# Patient Record
Sex: Female | Born: 1945 | Hispanic: No | Marital: Married | Smoking: Never smoker
Health system: Southern US, Community
[De-identification: ages and names within clinical notes are randomized; demographics above are authoritative.]

## PROBLEM LIST (undated history)

## (undated) DIAGNOSIS — K5190 Ulcerative colitis, unspecified, without complications: Secondary | ICD-10-CM

## (undated) DIAGNOSIS — E785 Hyperlipidemia, unspecified: Secondary | ICD-10-CM

## (undated) DIAGNOSIS — R011 Cardiac murmur, unspecified: Secondary | ICD-10-CM

## (undated) DIAGNOSIS — Z9889 Other specified postprocedural states: Secondary | ICD-10-CM

## (undated) DIAGNOSIS — F419 Anxiety disorder, unspecified: Secondary | ICD-10-CM

## (undated) DIAGNOSIS — J45909 Unspecified asthma, uncomplicated: Secondary | ICD-10-CM

## (undated) DIAGNOSIS — M1990 Unspecified osteoarthritis, unspecified site: Secondary | ICD-10-CM

## (undated) DIAGNOSIS — Z8659 Personal history of other mental and behavioral disorders: Secondary | ICD-10-CM

## (undated) DIAGNOSIS — K589 Irritable bowel syndrome without diarrhea: Secondary | ICD-10-CM

## (undated) DIAGNOSIS — C4491 Basal cell carcinoma of skin, unspecified: Secondary | ICD-10-CM

## (undated) DIAGNOSIS — R112 Nausea with vomiting, unspecified: Secondary | ICD-10-CM

## (undated) DIAGNOSIS — D649 Anemia, unspecified: Secondary | ICD-10-CM

## (undated) DIAGNOSIS — D099 Carcinoma in situ, unspecified: Secondary | ICD-10-CM

## (undated) DIAGNOSIS — D229 Melanocytic nevi, unspecified: Secondary | ICD-10-CM

## (undated) DIAGNOSIS — K529 Noninfective gastroenteritis and colitis, unspecified: Secondary | ICD-10-CM

## (undated) HISTORY — PX: TONSILLECTOMY: SUR1361

## (undated) HISTORY — DX: Noninfective gastroenteritis and colitis, unspecified: K52.9

## (undated) HISTORY — DX: Ulcerative colitis, unspecified, without complications: K51.90

## (undated) HISTORY — DX: Melanocytic nevi, unspecified: D22.9

## (undated) HISTORY — DX: Basal cell carcinoma of skin, unspecified: C44.91

## (undated) HISTORY — PX: TUBAL LIGATION: SHX77

## (undated) HISTORY — DX: Carcinoma in situ, unspecified: D09.9

## (undated) HISTORY — DX: Unspecified asthma, uncomplicated: J45.909

## (undated) HISTORY — DX: Hyperlipidemia, unspecified: E78.5

---

## 1993-07-23 HISTORY — PX: BRONCHOSCOPY: SUR163

## 2007-07-01 DIAGNOSIS — C4491 Basal cell carcinoma of skin, unspecified: Secondary | ICD-10-CM

## 2007-07-01 HISTORY — DX: Basal cell carcinoma of skin, unspecified: C44.91

## 2007-07-24 HISTORY — PX: COLONOSCOPY: SHX174

## 2011-02-12 DIAGNOSIS — D099 Carcinoma in situ, unspecified: Secondary | ICD-10-CM

## 2011-02-12 DIAGNOSIS — D229 Melanocytic nevi, unspecified: Secondary | ICD-10-CM

## 2011-02-12 HISTORY — DX: Carcinoma in situ, unspecified: D09.9

## 2011-02-12 HISTORY — DX: Melanocytic nevi, unspecified: D22.9

## 2019-04-16 ENCOUNTER — Encounter: Payer: Self-pay | Admitting: Orthopaedic Surgery

## 2019-04-16 ENCOUNTER — Ambulatory Visit: Payer: Self-pay

## 2019-04-16 ENCOUNTER — Other Ambulatory Visit: Payer: Self-pay

## 2019-04-16 ENCOUNTER — Ambulatory Visit (INDEPENDENT_AMBULATORY_CARE_PROVIDER_SITE_OTHER): Payer: Medicare Other | Admitting: Orthopaedic Surgery

## 2019-04-16 VITALS — BP 136/72 | HR 94 | Ht 64.0 in | Wt 145.0 lb

## 2019-04-16 DIAGNOSIS — M25562 Pain in left knee: Secondary | ICD-10-CM | POA: Diagnosis not present

## 2019-04-16 NOTE — Progress Notes (Signed)
Office Visit Note   Patient: Carol Roach           Date of Birth: Nov 20, 1945           MRN: [id number] Visit Date: 04/16/2019              Requested by: No referring provider defined for this encounter. PCP: No primary care provider on file.   Assessment & Plan: Visit Diagnoses:  1. Acute pain of left knee     Plan: Patient may have small chondral area of her knee with negative radiographs versus meniscal tear with repetitive catching.  Recommend she use a cane in her opposite right hand.  Intra-articular injection performed which she tolerated well with improvement in her pain.  She has persistent symptoms after couple weeks to let us know we can consider MRI imaging.  Offered her some physical therapy she will call if she like to schedule this.  Follow-Up Instructions: No follow-ups on file.   Orders:  Orders Placed This Encounter  Procedures  . XR KNEE 3 VIEW LEFT   No orders of the defined types were placed in this encounter.     Procedures: Large Joint Inj: L knee on 04/16/2019 3:40 PM Indications: joint swelling and pain Details: 22 G 1.5 in needle, anterolateral approach  Arthrogram: No  Medications: 0.5 mL lidocaine 1 %; 3 mL bupivacaine 0.5 %; 40 mg methylPREDNISolone acetate 40 MG/ML Outcome: tolerated well, no immediate complications Procedure, treatment alternatives, risks and benefits explained, specific risks discussed. Consent was given by the patient. Immediately prior to procedure a time out was called to verify the correct patient, procedure, equipment, support staff and site/side marked as required. Patient was prepped and draped in the usual sterile fashion.       Clinical Data: No additional findings.   Subjective: Chief Complaint  Patient presents with  . Left Knee - Pain    HPI 73 year old female seen with a new onset of left knee pain that began about a month or month and a half ago when she was getting up from a stool in the kitchen  and twisted her left knee.  She states since that time her left knee is giving out intermittently with pain along the medial aspect.  Sometimes when she gets up and moves she has to help lift her leg reaching behind her thigh states her knee occasionally pops or grabs and then has associated swelling.  She is also had pain laterally over the knee more recently.  Review of Systems negative for hip pain.  Patient does take Symbicort also some Celebrex for her arthritis.  Seasonal allergies on Flonase.  Increased cholesterol currently taking Crestor.  Otherwise negative is obtains HPI.   Objective: Vital Signs: BP 136/72   Pulse 94   Ht 5' 4"  (1.626 m)   Wt 145 lb (65.8 kg)   BMI 24.89 kg/m   Physical Exam Constitutional:      Appearance: She is well-developed.  HENT:     Head: Normocephalic.     Right Ear: External ear normal.     Left Ear: External ear normal.  Eyes:     Pupils: Pupils are equal, round, and reactive to light.  Neck:     Thyroid: No thyromegaly.     Trachea: No tracheal deviation.  Cardiovascular:     Rate and Rhythm: Normal rate.  Pulmonary:     Effort: Pulmonary effort is normal.  Abdominal:     Palpations: Abdomen  is soft.  Skin:    General: Skin is warm and dry.  Neurological:     Mental Status: She is alert and oriented to person, place, and time.  Psychiatric:        Behavior: Behavior normal.     Ortho Exam patient had negative logroll to the hips.  With flexion extension patient has more lateral joint line pain the medial.  Tenderness of both joint lines collateral ligaments are stable.  Negative Lockman test.  ACL PCL exam is normal distal pulses are 2+ and symmetrical.  No knee effusion normal patellar tracking.  Minimal crepitus with knee extension slightly worse on asymptomatic right knee than left knee.  Specialty Comments:  No specialty comments available.  Imaging: No results found.   PMFS History: There are no active problems to display  for this patient.  Past Medical History:  Diagnosis Date  . Asthma   . 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 BCC (basal cell carcinoma of skin) 07/01/2007   Right Lower Med Eyelid  . Colitis   . 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  Past Surgical History:  Procedure Laterality Date  . TONSILLECTOMY    . TUBAL LIGATION     Social History   Occupational History  . Not on file  Tobacco Use  . Smoking status: Never Smoker  . Smokeless tobacco: Never Used  Substance and Sexual Activity  . Alcohol use: Not Currently  . Drug use: Not on file  . Sexual activity: Not on file

## 2019-04-17 MED ORDER — BUPIVACAINE HCL 0.5 % IJ SOLN
3.0000 mL | INTRAMUSCULAR | Status: AC | PRN
  Administered 2019-04-16: 16:00:00 3 mL via INTRA_ARTICULAR

## 2019-04-17 MED ORDER — METHYLPREDNISOLONE ACETATE 40 MG/ML IJ SUSP
40.0000 mg | INTRAMUSCULAR | Status: AC | PRN
  Administered 2019-04-16: 16:00:00 40 mg via INTRA_ARTICULAR

## 2019-04-17 MED ORDER — LIDOCAINE HCL 1 % IJ SOLN
0.5000 mL | INTRAMUSCULAR | Status: AC | PRN
  Administered 2019-04-16: .5 mL

## 2019-05-04 ENCOUNTER — Telehealth: Payer: Self-pay | Admitting: Orthopaedic Surgery

## 2019-05-04 DIAGNOSIS — M25562 Pain in left knee: Secondary | ICD-10-CM

## 2019-05-04 NOTE — Telephone Encounter (Signed)
Patient would like for you to call her. Her call back number is [phone]. Would like to ask if she could get MRI in [place].

## 2019-05-05 NOTE — Telephone Encounter (Signed)
OK thank you. Proceed with MRI of her knee ROV after test, thank you.

## 2019-05-05 NOTE — Addendum Note (Signed)
Addended by: Meyer Cory on: 05/05/2019 09:17 AM   Modules accepted: Orders

## 2019-05-05 NOTE — Telephone Encounter (Signed)
Per note in chart, patient was going to call back in a few weeks if she was having persistent symptoms and MRI would be considered. Dr. Lorin Mercy spoke with patient and advised for her to call me if she wished to proceed with the MRI since he would be out of the office. Patient continues to have problems and would like MRI to be done at [facility] in [place]. I did advise patient to bring CD of MRI with her to follow up appointment for review.  She will call [place] office to schedule that appointment once she has time and date for MRI. Order entered.

## 2019-05-05 NOTE — Telephone Encounter (Signed)
Please see below. MRI ordered as you had requested if patient called me with persistent symptoms.

## 2019-05-06 NOTE — Telephone Encounter (Signed)
Order entered

## 2019-06-04 ENCOUNTER — Encounter: Payer: Self-pay | Admitting: Orthopaedic Surgery

## 2019-06-04 ENCOUNTER — Ambulatory Visit (INDEPENDENT_AMBULATORY_CARE_PROVIDER_SITE_OTHER): Payer: Medicare Other | Admitting: Orthopaedic Surgery

## 2019-06-04 DIAGNOSIS — M1712 Unilateral primary osteoarthritis, left knee: Secondary | ICD-10-CM | POA: Diagnosis not present

## 2019-06-04 NOTE — Progress Notes (Signed)
Office Visit Note   Patient: Carol Roach           Date of Birth: 1946/01/21           MRN: [id number] Visit Date: 06/04/2019              Requested by: No referring provider defined for this encounter. PCP: Patient, No Pcp Per   Assessment & Plan: Visit Diagnoses:  1. Unilateral primary osteoarthritis, left knee     Plan: Patient is only mild arthritis.  MRI scan was reviewed with patient and husband and I gave her a copy of the report.  She will return if she has increased problems.  She is happy with the results of treatment and is very pleased she does not require any surgical intervention.  Follow-up as needed.  Follow-Up Instructions: No follow-ups on file.   Orders:  No orders of the defined types were placed in this encounter.  No orders of the defined types were placed in this encounter.     Procedures: No procedures performed   Clinical Data: No additional findings.   Subjective: Chief Complaint  Patient presents with  . Left Knee - Pain, Follow-up    MRI Review    HPI 73 year old female with turns with left knee pain.  She got some relief with the injection which she states worked great for many days but since that time she still getting some improvement.  She is taking some Aleve over-the-counter occasionally.  No fever chills no history of gout.  MRI scan has been obtained and is available for review.  This shows some chondromalacia patella and mild medial and lateral chondromalacia without focal areas of significant wear.  No meniscal tears ligaments are normal.  Patient states overall her knee is been doing better.  She not sure if modifying some of her activities is improved it or if it still residual from the intra-articular injection.  Review of Systems updated unchanged last office visit.   Objective: Vital Signs: Ht 5' 4"  (1.626 m)   Wt 140 lb (63.5 kg)   BMI 24.03 kg/m   Physical Exam Constitutional:      Appearance: She is  well-developed.  HENT:     Head: Normocephalic.     Right Ear: External ear normal.     Left Ear: External ear normal.  Eyes:     Pupils: Pupils are equal, round, and reactive to light.  Neck:     Thyroid: No thyromegaly.     Trachea: No tracheal deviation.  Cardiovascular:     Rate and Rhythm: Normal rate.  Pulmonary:     Effort: Pulmonary effort is normal.  Abdominal:     Palpations: Abdomen is soft.  Skin:    General: Skin is warm and dry.  Neurological:     Mental Status: She is alert and oriented to person, place, and time.  Psychiatric:        Behavior: Behavior normal.     Ortho Exam patient has good range of motion no palpable Baker's cyst which was noted on MRI with small Baker's cyst.  Distal pulses are intact negative logroll of the hips no knee effusion minimal crepitus with knee flexion extension.  Specialty Comments:  No specialty comments available.  Imaging: No results found.   PMFS History: Patient Active Problem List   Diagnosis Date Noted  . Unilateral primary osteoarthritis, left knee 06/04/2019   Past Medical History:  Diagnosis Date  . Asthma   .  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 BCC (basal cell carcinoma of skin) 07/01/2007   Right Lower Med Eyelid  . Colitis   . 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  Past Surgical History:  Procedure Laterality Date  . TONSILLECTOMY    . TUBAL LIGATION     Social History   Occupational History  . Not on file  Tobacco Use  . Smoking status: Never Smoker  . Smokeless tobacco: Never Used  Substance and Sexual Activity  . Alcohol use: Not Currently  . Drug use: Not on file  . Sexual activity: Not on file

## 2019-07-28 ENCOUNTER — Encounter: Payer: Self-pay | Admitting: *Deleted

## 2019-08-31 ENCOUNTER — Ambulatory Visit: Payer: PRIVATE HEALTH INSURANCE

## 2019-09-24 ENCOUNTER — Encounter: Payer: Self-pay | Admitting: Orthopaedic Surgery

## 2019-09-24 ENCOUNTER — Ambulatory Visit (INDEPENDENT_AMBULATORY_CARE_PROVIDER_SITE_OTHER): Payer: Medicare Other | Admitting: Orthopaedic Surgery

## 2019-09-24 VITALS — BP 129/71 | HR 96 | Ht 64.0 in | Wt 145.0 lb

## 2019-09-24 DIAGNOSIS — M1712 Unilateral primary osteoarthritis, left knee: Secondary | ICD-10-CM

## 2019-09-24 NOTE — Progress Notes (Signed)
Office Visit Note   Patient: Carol Roach           Date of Birth: 03-Aug-1945           MRN: [id number] Visit Date: 09/24/2019              Requested by: No referring provider defined for this encounter. PCP: Patient, No Pcp Per   Assessment & Plan: Visit Diagnoses:  1. Primary osteoarthritis of left knee     Plan: Knee injection performed with good relief.  If she develops locking or is not able to walk she will let us know.  Previous x-rays were reviewed.  Follow-Up Instructions: No follow-ups on file.   Orders:  Orders Placed This Encounter  Procedures  . Large Joint Inj: L knee   No orders of the defined types were placed in this encounter.     Procedures: Large Joint Inj: L knee on 09/24/2019 3:03 PM Indications: joint swelling and pain Details: 22 G 1.5 in needle, anterolateral approach  Arthrogram: No  Medications: 0.5 mL lidocaine 1 %; 3 mL bupivacaine 0.5 %; 40 mg methylPREDNISolone acetate 40 MG/ML Outcome: tolerated well, no immediate complications Procedure, treatment alternatives, risks and benefits explained, specific risks discussed. Consent was given by the patient. Immediately prior to procedure a time out was called to verify the correct patient, procedure, equipment, support staff and site/side marked as required. Patient was prepped and draped in the usual sterile fashion.       Clinical Data: No additional findings.   Subjective: Chief Complaint  Patient presents with  . Left Knee - Pain, Follow-up    HPI 74 year old female 3 to 4 months follow-up for left knee pain with mild osteoarthritis.  She states she has had some recent swelling difficulty walking stiffness in her knee.  Problems with stairs getting from sitting to standing she has been using a knee brace using ice and elevating her leg at night plus over-the-counter anti-inflammatories.  Previous knee injection in September gave her about 2 months of relief.  Review of  Systems 14 point systems positive for Symbicort for pulmonary problems she is used Celebrex other anti-inflammatories for knee symptoms.  Flonase for allergies.  Increased cholesterol.  Negative CVA or MI no chest pain.  Objective: Vital Signs: BP 129/71   Pulse 96   Ht 5' 4"  (1.626 m)   Wt 145 lb (65.8 kg)   BMI 24.89 kg/m   Physical Exam Constitutional:      Appearance: She is well-developed.  HENT:     Head: Normocephalic.     Right Ear: External ear normal.     Left Ear: External ear normal.  Eyes:     Pupils: Pupils are equal, round, and reactive to light.  Neck:     Thyroid: No thyromegaly.     Trachea: No tracheal deviation.  Cardiovascular:     Rate and Rhythm: Normal rate.  Pulmonary:     Effort: Pulmonary effort is normal.  Abdominal:     Palpations: Abdomen is soft.  Skin:    General: Skin is warm and dry.  Neurological:     Mental Status: She is alert and oriented to person, place, and time.  Psychiatric:        Behavior: Behavior normal.     Ortho Exam patient has some crepitus with range of motion left knee.  She has some medial lateral joint line tenderness pain with patellofemoral loading and knee extension.  Specialty Comments:  No specialty comments available.  Imaging: No results found.   PMFS History: Patient Active Problem List   Diagnosis Date Noted  . Unilateral primary osteoarthritis, left knee 06/04/2019   Past Medical History:  Diagnosis Date  . Asthma   . 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 BCC (basal cell carcinoma of skin) 07/01/2007   Right Lower Med Eyelid  . Colitis   . 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  Past Surgical History:  Procedure Laterality Date  . TONSILLECTOMY    . TUBAL LIGATION     Social History   Occupational History  . Not on file   Tobacco Use  . Smoking status: Never Smoker  . Smokeless tobacco: Never Used  Substance and Sexual Activity  . Alcohol use: Not Currently  . Drug use: Not on file  . Sexual activity: Not on file

## 2019-09-27 MED ORDER — LIDOCAINE HCL 1 % IJ SOLN
0.5000 mL | INTRAMUSCULAR | Status: AC | PRN
  Administered 2019-09-24: .5 mL

## 2019-09-27 MED ORDER — METHYLPREDNISOLONE ACETATE 40 MG/ML IJ SUSP
40.0000 mg | INTRAMUSCULAR | Status: AC | PRN
  Administered 2019-09-24: 40 mg via INTRA_ARTICULAR

## 2019-09-27 MED ORDER — BUPIVACAINE HCL 0.5 % IJ SOLN
3.0000 mL | INTRAMUSCULAR | Status: AC | PRN
  Administered 2019-09-24: 3 mL via INTRA_ARTICULAR

## 2019-10-02 ENCOUNTER — Telehealth: Payer: Self-pay | Admitting: Radiology

## 2019-10-02 ENCOUNTER — Telehealth: Payer: Self-pay | Admitting: Orthopaedic Surgery

## 2019-10-02 DIAGNOSIS — M1712 Unilateral primary osteoarthritis, left knee: Secondary | ICD-10-CM

## 2019-10-02 NOTE — Addendum Note (Signed)
Addended by: Meyer Cory on: 10/02/2019 04:59 PM   Modules accepted: Orders

## 2019-10-02 NOTE — Telephone Encounter (Signed)
I called and spoke with patient. She would like to go to [facility] in [place] for PT. Phone: [phone] Fax:  [phone]  Script for PT faxed. Patient aware.

## 2019-10-02 NOTE — Telephone Encounter (Signed)
Patient called.   She is requesting a call back from [place]. No further detail.   Call back: [phone]

## 2019-10-02 NOTE — Telephone Encounter (Signed)
I called her and discussed. Her MRI in the fall only showed mild chondral wear changes no definite meniscal tear. Set her up for some physical therapy and then will she will get the name and phone number and fax number she can fax the order evaluate and treat knee pain with giving way. She can follow-up with me in 5 to 6weeks. I told her we would make the phone call about getting approval for her therapy. Thank you

## 2019-10-02 NOTE — Telephone Encounter (Signed)
I called patient 

## 2019-10-02 NOTE — Telephone Encounter (Signed)
Patient called [facility] office and states injection that she received has not helped at all. She is concerned that she is going to fall and would like to schedule surgery.   Please advise. CB [phone]

## 2019-10-05 ENCOUNTER — Telehealth: Payer: Self-pay | Admitting: Orthopaedic Surgery

## 2019-10-05 NOTE — Telephone Encounter (Signed)
Can we change the location on the referral?

## 2019-10-05 NOTE — Telephone Encounter (Signed)
Patient called wanting to have PT in [place] instead of [place], [place].  CB#[phone].  Thank you.

## 2019-10-05 NOTE — Telephone Encounter (Signed)
Sent to [facility] PT in [place]. Patient notified.

## 2019-11-04 ENCOUNTER — Other Ambulatory Visit: Payer: Self-pay

## 2019-11-04 ENCOUNTER — Ambulatory Visit (INDEPENDENT_AMBULATORY_CARE_PROVIDER_SITE_OTHER): Payer: Self-pay | Admitting: *Deleted

## 2019-11-04 DIAGNOSIS — Z1211 Encounter for screening for malignant neoplasm of colon: Secondary | ICD-10-CM

## 2019-11-04 MED ORDER — NA SULFATE-K SULFATE-MG SULF 17.5-3.13-1.6 GM/177ML PO SOLN: 1.0000 | Freq: Once | ORAL | 0 refills | Status: AC

## 2019-11-04 NOTE — Patient Instructions (Signed)
Carol Roach  [birth year] MRN: [id number]     Procedure Date: 01/06/2020 Time to register: 8:30 am Place to register: [facility] Procedure Time: 9:30 am Scheduled provider: Dr. Gala Romney    PREPARATION FOR COLONOSCOPY WITH SUPREP BOWEL PREP KIT  Note: Suprep Bowel Prep Kit is a split-dose (2day) regimen. Consumption of BOTH 6-ounce bottles is required for a complete prep.  Please notify us immediately if you are diabetic, take iron supplements, or if you are on Coumadin or any other blood thinners.  Please hold the following medications: n/a                                                                                                                                                  2 DAYS BEFORE PROCEDURE:  DATE: 01/04/2020   DAY: Monday Begin clear liquid diet AFTER your lunch meal. NO SOLID FOODS after this point.  1 DAY BEFORE PROCEDURE:  DATE: 01/05/2020   DAY: Tuesday Continue clear liquids the entire day - NO SOLID FOOD.   Diabetic medications adjustments for today: n/a  At 6:00pm: Complete steps 1 through 4 below, using ONE (1) 6-ounce bottle, before going to bed. Step 1:  Pour ONE (1) 6-ounce bottle of SUPREP liquid into the mixing container.  Step 2:  Add cool drinking water to the 16 ounce line on the container and mix.  Note: Dilute the solution concentrate as directed prior to use. Step 3:  DRINK ALL the liquid in the container. Step 4:  You MUST drink an additional two (2) or more 16 ounce containers of water over the next one (1) hour.   Continue clear liquids.  DAY OF PROCEDURE:   DATE: 01/06/2020   DAY: Wednesday If you take medications for your heart, blood pressure, or breathing, you may take these medications.  Diabetic medications adjustments for today: n/a  5 hours before your procedure at 4:30 am: Step 1:  Pour ONE (1) 6-ounce bottle of SUPREP liquid into the mixing container.  Step 2:  Add cool drinking water to the 16 ounce line on the  container and mix.  Note: Dilute the solution concentrate as directed prior to use. Step 3:  DRINK ALL the liquid in the container. Step 4:  You MUST drink an additional two (2) or more 16 ounce containers of water over the next one (1) hour. You MUST complete the final glass of water at least 3 hours before your colonoscopy. Nothing by mouth past 6:30 am.  You may take your morning medications with sip of water unless we have instructed otherwise.    Please see below for Dietary Information.  CLEAR LIQUIDS INCLUDE:  Water Jello (NOT red in color)   Ice Popsicles (NOT red in color)   Tea (sugar ok, no milk/cream) Powdered fruit flavored drinks  Coffee (sugar ok, no  milk/cream) Gatorade/ Lemonade/ Kool-Aid  (NOT red in color)   Juice: apple, white grape, white cranberry Soft drinks  Clear bullion, consomme, broth (fat free beef/chicken/vegetable)  Carbonated beverages (any kind)  Strained chicken noodle soup Hard Candy   Remember: Clear liquids are liquids that will allow you to see your fingers on the other side of a clear glass. Be sure liquids are NOT red in color, and not cloudy, but CLEAR.  DO NOT EAT OR DRINK ANY OF THE FOLLOWING:  Dairy products of any kind   Cranberry juice Tomato juice / V8 juice   Grapefruit juice Orange juice     Red grape juice  Do not eat any solid foods, including such foods as: cereal, oatmeal, yogurt, fruits, vegetables, creamed soups, eggs, bread, crackers, pureed foods in a blender, etc.   HELPFUL HINTS FOR DRINKING PREP SOLUTION:   Make sure prep is extremely cold. Mix and refrigerate the the morning of the prep. You may also put in the freezer.   You may try mixing some Crystal Light or Country Time Lemonade if you prefer. Mix in small amounts; add more if necessary.  Try drinking through a straw  Rinse mouth with water or a mouthwash between glasses, to remove after-taste.  Try sipping on a cold beverage /ice/ popsicles between glasses of  prep.  Place a piece of sugar-free hard candy in mouth between glasses.  If you become nauseated, try consuming smaller amounts, or stretch out the time between glasses. Stop for 30-60 minutes, then slowly start back drinking.     OTHER INSTRUCTIONS  You will need a responsible adult at least 75 years of age to accompany you and drive you home. This person must remain in the waiting room during your procedure. The hospital will cancel your procedure if you do not have a responsible adult with you.   1. Wear loose fitting clothing that is easily removed. 2. Leave jewelry and other valuables at home.  3. Remove all body piercing jewelry and leave at home. 4. Total time from sign-in until discharge is approximately 2-3 hours. 5. You should go home directly after your procedure and rest. You can resume normal activities the day after your procedure. 6. The day of your procedure you should not:  Drive  Make legal decisions  Operate machinery  Drink alcohol  Return to work   You may call the office (Dept: [phone]) before 5:00pm, or page the doctor on call [phone]) after 5:00pm, for further instructions, if necessary.   Insurance Information YOU WILL NEED TO CHECK WITH YOUR INSURANCE COMPANY FOR THE BENEFITS OF COVERAGE YOU HAVE FOR THIS PROCEDURE.  UNFORTUNATELY, NOT ALL INSURANCE COMPANIES HAVE BENEFITS TO COVER ALL OR PART OF THESE TYPES OF PROCEDURES.  IT IS YOUR RESPONSIBILITY TO CHECK YOUR BENEFITS, HOWEVER, WE WILL BE GLAD TO ASSIST YOU WITH ANY CODES YOUR INSURANCE COMPANY MAY NEED.    PLEASE NOTE THAT MOST INSURANCE COMPANIES WILL NOT COVER A SCREENING COLONOSCOPY FOR PEOPLE UNDER THE AGE OF 50  IF YOU HAVE BCBS INSURANCE, YOU MAY HAVE BENEFITS FOR A SCREENING COLONOSCOPY BUT IF POLYPS ARE FOUND THE DIAGNOSIS WILL CHANGE AND THEN YOU MAY HAVE A DEDUCTIBLE THAT WILL NEED TO BE MET. SO PLEASE MAKE SURE YOU CHECK YOUR BENEFITS FOR A SCREENING COLONOSCOPY AS WELL AS A  DIAGNOSTIC COLONOSCOPY.

## 2019-11-04 NOTE — Progress Notes (Addendum)
Gastroenterology Pre-Procedure Review  Request Date: 11/04/2019 Requesting Physician: Dr. Rogelia Mire, Last TCS 12/31/2007 done by Dr. [place] Carbo, nonspecific mild chronic inflammation of colon  PATIENT REVIEW QUESTIONS: The patient responded to the following health history questions as indicated:    1. Diabetes Melitis: no 2. Joint replacements in the past 12 months: no 3. Major health problems in the past 3 months: no 4. Has an artificial valve or MVP: no 5. Has a defibrillator: no 6. Has been advised in past to take antibiotics in advance of a procedure like teeth cleaning: yes, but hasn't had to do it in over 20 years 7. Family history of colon cancer: no 8. Alcohol Use: no 9. Illicit drug Use: no 10. History of sleep apnea: no  11. History of coronary artery or other vascular stents placed within the last 12 months: no 12. History of any prior anesthesia complications: yes, n/v 13. There is no height or weight on file to calculate BMI. ht: 5'4 1/5 wt: 145 lbs     MEDICATIONS & ALLERGIES:    Patient reports the following regarding taking any blood thinners:   Plavix? no Aspirin? no Coumadin? no Brilinta? no Xarelto? no Eliquis? no Pradaxa? no Savaysa? no Effient? no  Patient confirms/reports the following medications:  Current Outpatient Medications  Medication Sig Dispense Refill  . Ascorbic Acid (VITAMIN C) 1000 MG tablet Take 1,000 mg by mouth daily.    . betamethasone valerate (VALISONE) 0.1 % cream Apply topically as needed.    . Calcium Carb-Cholecalciferol (CALCIUM-VITAMIN D3) 600-200 MG-UNIT TABS Take by mouth daily.     . cetirizine (ZYRTEC) 10 MG tablet Take 10 mg by mouth as needed.     . Cholecalciferol (VITAMIN D) 50 MCG (2000 UT) tablet Take 2,000 Units by mouth daily.    . Magnesium 500 MG TABS Take by mouth daily.    . montelukast (SINGULAIR) 10 MG tablet Take 10 mg by mouth daily.    . Multiple Vitamins-Minerals (CENTRUM SILVER ADULT 50+ PO) Take by  mouth daily.     . rosuvastatin (CRESTOR) 5 MG tablet Take 5 mg by mouth daily.    . Triamcinolone Acetonide (NASACORT ALLERGY 24HR NA) Place into the nose as needed.     . vitamin B-12 (CYANOCOBALAMIN) 500 MCG tablet Take 1,000 mcg by mouth daily.      No current facility-administered medications for this visit.    Patient confirms/reports the following allergies:  Allergies  Allergen Reactions  . Codeine   . Latex     No orders of the defined types were placed in this encounter.   AUTHORIZATION INFORMATION Primary Insurance: Medicare,  ID #: [id number] Pre-Cert / Auth required: No, not required  Secondary Insurance: [employer]  ID #: [id number] Pre-Cert / Josem Kaufmann required: Pre-Cert / Auth #:   SCHEDULE INFORMATION: Procedure has been scheduled as follows:  Date: 01/06/2020, Time: 9:30 Location: Dr. Gala Romney  This Gastroenterology Pre-Precedure Review Form is being routed to the following provider(s): Roseanne Kaufman, NP

## 2019-11-09 NOTE — Progress Notes (Signed)
May need to verify this with Almyra Free, but do we bring patients in to discuss risks/benefits if 44?

## 2019-11-10 ENCOUNTER — Telehealth: Payer: Self-pay | Admitting: *Deleted

## 2019-11-10 NOTE — Telephone Encounter (Signed)
Tried to call pt but no vm set up.

## 2019-11-11 NOTE — Telephone Encounter (Signed)
Spoke with pt and informed her that we were cancelling the procedure and Covid screening.  She was informed that she would need an ov first to discuss risks/benefits since she is 67.  She was informed that usually one is not required for ages 81 and up unless she just elected to do so or if there was a problem.  Pt stated she didn't see any need in doing another one.  She said that she would call us if she has any problems.

## 2019-11-11 NOTE — Progress Notes (Signed)
Spoke with pt and informed her that we were cancelling the procedure and Covid screening.  She was informed that she would need an ov first to discuss risks/benefits since she is 55.  She was informed that usually one is not required for ages 34 and up unless she just elected to do so or if there was a problem.  Pt stated she didn't see any need in doing another one.  She said that she would call us if she has any problems.

## 2020-01-04 ENCOUNTER — Other Ambulatory Visit (HOSPITAL_COMMUNITY): Payer: PRIVATE HEALTH INSURANCE

## 2020-01-21 ENCOUNTER — Ambulatory Visit (INDEPENDENT_AMBULATORY_CARE_PROVIDER_SITE_OTHER): Payer: Medicare Other | Admitting: Orthopaedic Surgery

## 2020-01-21 ENCOUNTER — Encounter: Payer: Self-pay | Admitting: Orthopaedic Surgery

## 2020-01-21 ENCOUNTER — Ambulatory Visit: Payer: Self-pay

## 2020-01-21 VITALS — BP 107/70 | HR 92 | Ht 64.0 in | Wt 145.0 lb

## 2020-01-21 DIAGNOSIS — M542 Cervicalgia: Secondary | ICD-10-CM

## 2020-01-21 DIAGNOSIS — M545 Low back pain, unspecified: Secondary | ICD-10-CM

## 2020-01-21 DIAGNOSIS — G8929 Other chronic pain: Secondary | ICD-10-CM

## 2020-01-21 NOTE — Progress Notes (Signed)
Office Visit Note   Patient: Carol Roach           Date of Birth: 10-28-1945           MRN: [id number] Visit Date: 01/21/2020              Requested by: Donalynn Furlong, [place] [address] PCP: Donalynn Furlong, MD   Assessment & Plan: Visit Diagnoses:  1. Neck pain   2. Chronic bilateral low back pain, unspecified whether sciatica present     Plan: Patient still able to play golf.  We discussed lumbar imaging if her symptoms got worse.  She has good cervical range of motion no brachial plexus tenderness and intact upper extremity reflexes.  Principal problem appears to be lumbar L3-4 good activity level still is been good.  If she develops progressive radicular symptoms she can return otherwise recheck 3 months.  Left knee symptoms seem to be fairly stable.  She has to be careful with uneven ground.  Follow-Up Instructions: No follow-ups on file.   Orders:  Orders Placed This Encounter  Procedures   XR Cervical Spine 2 or 3 views   XR Lumbar Spine 2-3 Views   No orders of the defined types were placed in this encounter.     Procedures: No procedures performed   Clinical Data: No additional findings.   Subjective: Chief Complaint  Patient presents with   Left Knee - Pain, Follow-up    HPI 74 year old female returns she states the injection in her knee really did not help she continues to have back pain some buttocks pain pain in her thigh and states she was having some difficulty with ambulation until she got it the massage was has helped.  She states she was walking straight after that.  Patient states she still been able to play golf.  She denies any bowel bladder associated symptoms no numbness.  She does have some discomfort lumbosacral junction more left buttocks and right.  Review of Systems Review of systems update unchanged from 09/24/2019 other than as mentioned HPI.  Objective: Vital Signs: BP 107/70     Pulse 92    Ht 5' 4"  (1.626 m)    Wt 145 lb (65.8 kg)    BMI 24.89 kg/m   Physical Exam Constitutional:      Appearance: She is well-developed.  HENT:     Head: Normocephalic.     Right Ear: External ear normal.     Left Ear: External ear normal.  Eyes:     Pupils: Pupils are equal, round, and reactive to light.  Neck:     Thyroid: No thyromegaly.     Trachea: No tracheal deviation.  Cardiovascular:     Rate and Rhythm: Normal rate.  Pulmonary:     Effort: Pulmonary effort is normal.  Abdominal:     Palpations: Abdomen is soft.  Skin:    General: Skin is warm and dry.  Neurological:     Mental Status: She is alert and oriented to person, place, and time.  Psychiatric:        Behavior: Behavior normal.     Ortho Exam patient has negative straight leg raising 90 degrees.  No limitation of internal rotation right or left hip but has limitation of left hip external rotation only 30 degrees not particularly painful.  She cannot figure 4 on the left but can do it on the right.  Knee reach full  extension.  Mild sciatic notch tenderness on the left negative on the right knee and ankle jerk are intact.  No atrophy good ankle dorsiflexion.  Specialty Comments:  No specialty comments available.  Imaging: No results found.   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  Past Surgical History:  Procedure Laterality Date   TONSILLECTOMY     TUBAL LIGATION     Social History   Occupational History   Not on file    Tobacco Use   Smoking status: Never Smoker   Smokeless tobacco: Never Used  Substance and Sexual Activity   Alcohol use: Not Currently   Drug use: Not on file   Sexual activity: Not on file

## 2020-04-21 ENCOUNTER — Other Ambulatory Visit: Payer: Self-pay

## 2020-04-21 ENCOUNTER — Ambulatory Visit (INDEPENDENT_AMBULATORY_CARE_PROVIDER_SITE_OTHER): Payer: Medicare Other | Admitting: Orthopaedic Surgery

## 2020-04-21 DIAGNOSIS — M1712 Unilateral primary osteoarthritis, left knee: Secondary | ICD-10-CM

## 2020-04-21 NOTE — Progress Notes (Signed)
Office Visit Note   Patient: Carol Roach           Date of Birth: June 18, 1946           MRN: [id number] Visit Date: 04/21/2020              Requested by: Donalynn Furlong, [place] [address] PCP: Donalynn Furlong, MD   Assessment & Plan: Visit Diagnoses:  1. Unilateral primary osteoarthritis, left knee   2.     Low back pain  Plan: Patient does improve with therapy.  She can continue using some heat stretching walking activity.  She still able play golf frequently and will call if she has increased symptoms that limit her activity.  Follow-Up Instructions: No follow-ups on file.   Orders:  No orders of the defined types were placed in this encounter.  No orders of the defined types were placed in this encounter.     Procedures: No procedures performed   Clinical Data: No additional findings.   Subjective: Chief Complaint  Patient presents with  . Left Knee - Follow-up  . Lower Back - Follow-up    HPI 74 year old female returns with some ongoing problems with back pain and left knee osteoarthritis.  She states she still been able to play golf.  She has some problems with stairs sometimes squatting or stepping up on a pulse that can be somewhat of a problem.  She has some back pain buttocks pain.  She is finished therapy and felt like that is been helpful for her.  She denies any numbness or tingling in her feet.  She uses heat at bedtime and also has been using some Aleve which is improved her symptoms.  Review of Systems 14 point update unchanged from 09/24/2019 office visit.   Objective: Vital Signs: There were no vitals taken for this visit.  Physical Exam Constitutional:      Appearance: She is well-developed.  HENT:     Head: Normocephalic.     Right Ear: External ear normal.     Left Ear: External ear normal.  Eyes:     Pupils: Pupils are equal, round, and reactive to light.  Neck:     Thyroid: No  thyromegaly.     Trachea: No tracheal deviation.  Cardiovascular:     Rate and Rhythm: Normal rate.  Pulmonary:     Effort: Pulmonary effort is normal.  Abdominal:     Palpations: Abdomen is soft.  Skin:    General: Skin is warm and dry.  Neurological:     Mental Status: She is alert and oriented to person, place, and time.  Psychiatric:        Behavior: Behavior normal.     Ortho Exam negative straight leg raising 90 degrees.  Some crepitus with knee flexion and extension and patellofemoral loading.  No varus or valgus deformity.  Distal pulses are intact.  Ambulation without limp.  Negative Trendelenburg.  Specialty Comments:  No specialty comments available.  Imaging: No results found.   PMFS History: Patient Active Problem List   Diagnosis Date Noted  . Unilateral primary osteoarthritis, left knee 06/04/2019   Past Medical History:  Diagnosis Date  . Asthma   . 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 BCC (basal cell carcinoma of skin) 07/01/2007   Right  Lower Med Eyelid  . Colitis   . 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  Past Surgical History:  Procedure Laterality Date  . TONSILLECTOMY    . TUBAL LIGATION     Social History   Occupational History  . Not on file  Tobacco Use  . Smoking status: Never Smoker  . Smokeless tobacco: Never Used  Substance and Sexual Activity  . Alcohol use: Not Currently  . Drug use: Not on file  . Sexual activity: Not on file

## 2020-06-01 ENCOUNTER — Ambulatory Visit: Payer: PRIVATE HEALTH INSURANCE | Admitting: Dermatology

## 2020-06-09 ENCOUNTER — Encounter: Payer: Self-pay | Admitting: Gastroenterology

## 2020-06-23 ENCOUNTER — Encounter: Payer: Self-pay | Admitting: Orthopaedic Surgery

## 2020-06-23 ENCOUNTER — Ambulatory Visit (INDEPENDENT_AMBULATORY_CARE_PROVIDER_SITE_OTHER): Payer: Medicare Other | Admitting: Orthopaedic Surgery

## 2020-06-23 ENCOUNTER — Other Ambulatory Visit: Payer: Self-pay

## 2020-06-23 ENCOUNTER — Ambulatory Visit (INDEPENDENT_AMBULATORY_CARE_PROVIDER_SITE_OTHER): Payer: Medicare Other

## 2020-06-23 VITALS — Ht 65.5 in | Wt 142.0 lb

## 2020-06-23 DIAGNOSIS — M1712 Unilateral primary osteoarthritis, left knee: Secondary | ICD-10-CM

## 2020-06-23 NOTE — Progress Notes (Signed)
Office Visit Note   Patient: Carol Roach           Date of Birth: [birth year]           MRN: [id number] Visit Date: 06/23/2020              Requested by: Donalynn Furlong, [place] [address] PCP: Donalynn Furlong, MD   Assessment & Plan: Visit Diagnoses:  1. Unilateral primary osteoarthritis, left knee     Plan: Patient's had locking symptoms in her knee 3 days where she could not walk.  She did not have full extension of her knee but now is able to ambulate with only moderate discomfort and can get her knee fully extended.  We will proceed with MRI scan rule out lateral meniscal tear or cartilage flap tear.  I discussed again with her currently her left knee radiographs do not look severe enough to consider total knee arthroplasty at this point.  She may have more significant arthritis than plain radiographs would suggest that this also will can be determined from MRI scan.  Follow-up after left knee MRI.  Follow-Up Instructions: Return after left knee MRI scan.  Orders:  Orders Placed This Encounter  Procedures   XR Knee 1-2 Views Left   No orders of the defined types were placed in this encounter.     Procedures: No procedures performed   Clinical Data: No additional findings.   Subjective: Chief Complaint  Patient presents with   Left Knee - Pain    HPI 74 year old female returns with ongoing problems with left knee pain.  Patient states Sunday Monday and Tuesday she could barely put her foot down.  She only plays golf regularly.  She use some Salonpas without significant relief.  She states is doing better although she did go to [facility] and got an IM injection in her buttocks with steroids and not sure if that helped.  She states she has had catching in her left knee with sharp pain along the lateral joint line.  She states is so painful that she is ready to proceed with total knee arthroplasty.  She denies any  specific injury that occurred when her knee locked up with sharp lateral joint line pain.  She denies any groin pain no associated back symptoms with the knee but she has had some discomfort at the lumbosacral junction at times and some tenderness over the sciatic notch but it does not seem to stop her from playing golf.  Review of Systems all of systems updated and are noncontributory.   Objective: Vital Signs: Ht 5' 5.5" (1.664 m)    Wt 142 lb (64.4 kg)    BMI 23.27 kg/m   Physical Exam Constitutional:      Appearance: She is well-developed.  HENT:     Head: Normocephalic.     Right Ear: External ear normal.     Left Ear: External ear normal.  Eyes:     Pupils: Pupils are equal, round, and reactive to light.  Neck:     Thyroid: No thyromegaly.     Trachea: No tracheal deviation.  Cardiovascular:     Rate and Rhythm: Normal rate.  Pulmonary:     Effort: Pulmonary effort is normal.  Abdominal:     Palpations: Abdomen is soft.  Skin:    General: Skin is warm and dry.  Neurological:     Mental Status: She is alert and oriented  to person, place, and time.  Psychiatric:        Behavior: Behavior normal.     Ortho Exam patient has lateral joint line tenderness.  Collateral ligaments are stable ACL PCL exam is normal there is trace effusion.  With flexion extension she feels sharp pain laterally over the lateral joint line at the lateral collateral ligament.  No Baker's cyst pes bursa is normal negative logroll of the hips.  Mild sciatic notch tenderness left greater than right.  Minimal tenderness lumbosacral junction in the midline.  Specialty Comments:  No specialty comments available.  Imaging: No results found.   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  Past Surgical History:  Procedure Laterality Date   TONSILLECTOMY     TUBAL LIGATION     Social History   Occupational History   Not on file  Tobacco Use   Smoking status: Never Smoker   Smokeless tobacco: Never Used  Substance and Sexual Activity   Alcohol use: Not Currently   Drug use: Not on file   Sexual activity: Not on file

## 2020-06-23 NOTE — Addendum Note (Signed)
Addended by: Meyer Cory on: 06/23/2020 04:41 PM   Modules accepted: Orders

## 2020-07-21 ENCOUNTER — Encounter: Payer: Self-pay | Admitting: Orthopaedic Surgery

## 2020-07-21 ENCOUNTER — Ambulatory Visit (INDEPENDENT_AMBULATORY_CARE_PROVIDER_SITE_OTHER): Payer: Medicare Other | Admitting: Orthopaedic Surgery

## 2020-07-21 ENCOUNTER — Other Ambulatory Visit: Payer: Self-pay

## 2020-07-21 VITALS — Ht 65.5 in | Wt 142.0 lb

## 2020-07-21 DIAGNOSIS — M1712 Unilateral primary osteoarthritis, left knee: Secondary | ICD-10-CM

## 2020-07-21 NOTE — Progress Notes (Signed)
Office Visit Note   Patient: Carol Roach           Date of Birth: 03-08-1946           MRN: [id number] Visit Date: 07/21/2020              Requested by: Donalynn Furlong, [place] [address] PCP: Donalynn Furlong, MD   Assessment & Plan: Visit Diagnoses:  1. Unilateral primary osteoarthritis, left knee     Plan: Patient in 3 exercise program anti-inflammatories including ibuprofen, Celebrex.  Intra-articular injections are not effective.  MRI scan shows tricompartmental degenerative changes.  Patient states she like to go ahead and proceed with total knee arthroplasty so she be ready for golf late spring and summertime.  We discussed overnight stay, home physical therapy, outpatient therapy after home therapy.  Risks of infection.  We discussed likely spinal anesthesia abductor block, Exparel Marcaine.  Questions were elicited and answered.  Her husband was present included in the discussion.  Patient understands and requests we proceed with surgical scheduling.  Follow-Up Instructions: No follow-ups on file.   Orders:  No orders of the defined types were placed in this encounter.  No orders of the defined types were placed in this encounter.     Procedures: No procedures performed   Clinical Data: No additional findings.   Subjective: Chief Complaint  Patient presents with  . Left Knee - Pain, Follow-up    MRI review    HPI 74 year old female returns with ongoing problems with left knee osteoarthritis.  She has been treated conservatively with anti-inflammatories previous injection.  She has degenerative changes present on her plain radiograph consistent with moderate osteoarthritis and an MRI scan has been obtained which shows tricompartmental degenerative changes with severe chondromalacia patella focal defects on the femoral-tibial compartments and intact meniscus.  She has had persistent catching problems with her knee  that bothers her when she tries to play golf which is her avocation.  Patient states she is now ready to proceed with total knee arthroplasty since anti-inflammatories and injections have not been effective.  Review of Systems 14 point system positive for seasonal allergies increased cholesterol.  Negative for CVA negative for chest pain.  Prior to progressive knee osteoarthritis patient is played golf several times a week.   Objective: Vital Signs: Ht 5' 5.5" (1.664 m)   Wt 142 lb (64.4 kg)   BMI 23.27 kg/m   Physical Exam Constitutional:      Appearance: She is well-developed.  HENT:     Head: Normocephalic.     Right Ear: External ear normal.     Left Ear: External ear normal.  Eyes:     Pupils: Pupils are equal, round, and reactive to light.  Neck:     Thyroid: No thyromegaly.     Trachea: No tracheal deviation.  Cardiovascular:     Rate and Rhythm: Normal rate.  Pulmonary:     Effort: Pulmonary effort is normal.  Abdominal:     Palpations: Abdomen is soft.  Skin:    General: Skin is warm and dry.  Neurological:     Mental Status: She is alert and oriented to person, place, and time.  Psychiatric:        Mood and Affect: Mood and affect normal.        Behavior: Behavior normal.     Ortho Exam patient has crepitus with left knee range of motion medial lateral  joint line tenderness.  Negative logroll the hips distal pulses are 2+ and symmetrical.  No varus or valgus deformity.  She reaches full extension flexes past 120 degrees.  Specialty Comments:  No specialty comments available.  Imaging: Knee MRI done in [place] on 07/06/2020 shows tricompartmental degenerative changes severe patellofemoral changes bone-on-bone changes and focal areas of cartilage wear medial lateral compartment.  Menisci and ACL PCL were intact.   PMFS History: Patient Active Problem List   Diagnosis Date Noted  . Unilateral primary osteoarthritis, left knee 06/04/2019   Past Medical  History:  Diagnosis Date  . Asthma   . 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 BCC (basal cell carcinoma of skin) 07/01/2007   Right Lower Med Eyelid  . Colitis   . 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  Past Surgical History:  Procedure Laterality Date  . TONSILLECTOMY    . TUBAL LIGATION     Social History   Occupational History  . Not on file  Tobacco Use  . Smoking status: Never Smoker  . Smokeless tobacco: Never Used  Substance and Sexual Activity  . Alcohol use: Not Currently  . Drug use: Not on file  . Sexual activity: Not on file

## 2020-07-27 ENCOUNTER — Ambulatory Visit (INDEPENDENT_AMBULATORY_CARE_PROVIDER_SITE_OTHER): Payer: Medicare Other | Admitting: Gastroenterology

## 2020-07-27 ENCOUNTER — Other Ambulatory Visit: Payer: Self-pay

## 2020-07-27 ENCOUNTER — Encounter: Payer: Self-pay | Admitting: Gastroenterology

## 2020-07-27 DIAGNOSIS — K529 Noninfective gastroenteritis and colitis, unspecified: Secondary | ICD-10-CM | POA: Diagnosis not present

## 2020-07-27 DIAGNOSIS — K5900 Constipation, unspecified: Secondary | ICD-10-CM | POA: Insufficient documentation

## 2020-07-27 MED ORDER — LINACLOTIDE 72 MCG PO CAPS: 72.0000 ug | ORAL_CAPSULE | Freq: Every day | ORAL | 0 refills | Status: DC

## 2020-07-27 NOTE — Progress Notes (Signed)
Primary Care Physician:  Dhivianathan, Candida Peeling, MD  Primary Gastroenterologist:  Garfield Cornea, MD   Chief Complaint  Patient presents with  . Consult    Last TCS 5-6 years ago in [place], [place]. Reports was normal  . Constipation    Reports saw Dr. Colen Darling in [place], [place] and was told she had a fissure. Rx'd nitro ointment    HPI:  Carol Roach is a 75 y.o. female here for consideration of colonoscopy. She was referred by Dr. Rogelia Mire.  We received referral back in April for colonoscopy, at that time she was advised that given her age of greater than 5, we would offer her office visit to discuss possible colonoscopy.  She was made aware that typically after 75, guidelines suggest she could forego ongoing colon cancer screening.  She elected not to pursue colonoscopy at that time.  She declined office visit.  She states she was not having any problems and she will call if there is any issues.  Her last colonoscopy we have on file was in June 2009 with Dr. West Carbo, found to have nonspecific mild chronic inflammation of the colon.  Indication for procedure, surveillance due to chronic ulcerative colitis.  Patient states that Dr. Davina Poke recommended she consider a colonoscopy and referred her again. She states she has a history of ulcerative colitis. Her sister had ulcerative colitis and had to have total colectomy. Denies FH colon cancer.  Patient was originally diagnosed with UC back in the 38s.  She had a flare in the 80s as well.  In the beginning she was taking Azulfidine, prescribed Dr. West Carbo.  Denies any recent regimen for UC. Patient had been doing well but over the past six months she has felt like her stools were incomplete. She developed rectal pain over the summer associated with constipation.  She was seen by her old vascular surgeon (who did hemorrhoid procedure on her 8-9 years ago), who states that she was having issues from a fissure and prescribed her nitroglycerin  ointment.  Rectal pain resolved after couple of weeks of topical nitroglycerin.  Last month she had recurrent rectal pain and started using her cream again and is doing better.  She denies any rectal bleeding.  She notes that her stools feel incomplete.  She has to go back a couple of times to finish.  Generally has several bowel movements every morning.  She has been taking either magnesium or a stool softener with laxative to facilitate bowel movements.  Rarely misses a day of one or the other of these medications.  She does have some postprandial urgency especially if she eats out.  Denies any significant abdominal pain.  No nausea or vomiting.  No upper GI symptoms.  Her biggest concern is trying to get her left knee replacement surgery scheduled.  Her granddaughter gets married in March and she wants to be fully recovered by then.  Patient states that she would like to consider having 1 more colonoscopy after she recovers from her knee surgery.  She notes that she typically vomits with liquid bowel preps.  Does not tolerate "the gallon" jug.  Prefers to have all her prep the day before her procedure.     Current Outpatient Medications  Medication Sig Dispense Refill  . Ascorbic Acid (VITAMIN C) 1000 MG tablet Take 1,000 mg by mouth daily.    . betamethasone valerate (VALISONE) 0.1 % cream Apply topically as needed.    . Calcium Carb-Cholecalciferol (CALCIUM-VITAMIN D3) 600-200 MG-UNIT  TABS Take by mouth daily.     . cetirizine (ZYRTEC) 10 MG tablet Take 10 mg by mouth as needed.     . Cholecalciferol (VITAMIN D) 50 MCG (2000 UT) tablet Take 2,000 Units by mouth daily.    .       . Magnesium 500 MG TABS Take by mouth daily.    . montelukast (SINGULAIR) 10 MG tablet Take 10 mg by mouth daily.    . Multiple Vitamins-Minerals (CENTRUM SILVER ADULT 50+ PO) Take by mouth daily.     . nitroGLYCERIN (NITROGLYN) 2 % ointment Apply topically 4 (four) times daily. 1% as needed    . rosuvastatin  (CRESTOR) 5 MG tablet Take 5 mg by mouth daily.    . vitamin B-12 (CYANOCOBALAMIN) 500 MCG tablet Take 1,000 mcg by mouth daily.      No current facility-administered medications for this visit.    Allergies as of 07/27/2020 - Review Complete 07/27/2020  Allergen Reaction Noted  . Codeine  10/14/2018  . Latex  10/14/2018    Past Medical History:  Diagnosis Date  . Asthma   . 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 BCC (basal cell carcinoma of skin) 07/01/2007   Right Lower Med Eyelid  . Hyperlipidemia   . 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 Ulcerative colitis [facility])         Past Surgical History:  Procedure Laterality Date  . COLONOSCOPY  2009   Dr. West Carbo: Procedure being done for surveillance for chronic ulcerative colitis.  No endoscopic appearance of active colitis.  Random colon biopsies taken to look for dysplasia.  Biopsy showed nonspecific mild chronic inflammation of the colon  . TONSILLECTOMY    . TUBAL LIGATION      Family History  Problem Relation Age of Onset  . Colitis Sister        require total colectomy. UC  . Lung cancer Sister   . Colon cancer Neg Hx     Social History   Socioeconomic History  . Marital status: Married    Spouse name: Duane  . Number of children: Not on file  . Years of education: Not on file  . Highest education level: Not on file  Occupational History  . Not on file  Tobacco Use  . Smoking status: Never Smoker  . Smokeless tobacco: Never Used  Substance and Sexual Activity  . Alcohol use: Not Currently  . Drug use: Not on file  . Sexual activity: Not on file  Other Topics Concern  . Not on file  Social History Narrative  . Not on file   Social Determinants of Health   Financial Resource Strain: Not on file  Food Insecurity: Not on file  Transportation Needs: Not on file  Physical  Activity: Not on file  Stress: Not on file  Social Connections: Not on file  Intimate Partner Violence: Not on file      ROS:  General: Negative for anorexia, weight loss, fever, chills, fatigue, weakness. Eyes: Negative for vision changes.  ENT: Negative for hoarseness, difficulty swallowing , nasal congestion. CV: Negative for chest pain, angina, palpitations, dyspnea on exertion, peripheral edema.  Respiratory: Negative for dyspnea at rest, dyspnea on exertion, cough, sputum, wheezing.  GI: See history of present illness. GU:  Negative for dysuria, hematuria, urinary incontinence, urinary  frequency, nocturnal urination.  MS: Negative for low back pain.  Chronic left knee pain Derm: Negative for rash or itching.  Neuro: Negative for weakness, abnormal sensation, seizure, frequent headaches, memory loss, confusion.  Psych: Negative for anxiety, depression, suicidal ideation, hallucinations.  Endo: Negative for unusual weight change.  Heme: Negative for bruising or bleeding. Allergy: Negative for rash or hives.    Physical Examination:  BP 126/71   Pulse 87   Temp (!) 96.9 F (36.1 C)   Ht 5' 6"  (1.676 m)   Wt 141 lb 6.4 oz (64.1 kg)   BMI 22.82 kg/m    General: Well-nourished, well-developed in no acute distress.  Head: Normocephalic, atraumatic.   Eyes: Conjunctiva pink, no icterus. Mouth: masked. Neck: Supple without thyromegaly, masses, or lymphadenopathy.  Lungs: Clear to auscultation bilaterally.  Heart: Regular rate and rhythm, no murmurs rubs or gallops.  Abdomen: Bowel sounds are normal, nontender, nondistended, no hepatosplenomegaly or masses, no abdominal bruits or    hernia , no rebound or guarding.   Rectal: not performed Extremities: No lower extremity edema. No clubbing or deformities.  Neuro: Alert and oriented x 4 , grossly normal neurologically.  Skin: Warm and dry, no rash or jaundice.   Psych: Alert and cooperative, normal mood and  affect.   Imaging Studies: No results found.  Impression/plan:  Pleasant 75 year old female with history of chronic ulcerative colitis dating back since the 1970s, has not been on any maintenance regimen for years, presents to discuss possible colonoscopy.  Patient believes her last colonoscopy was 5 to 6 years ago by Dr. West Carbo, prior to his retirement. We have requested most recent colonoscopy and pathology available.  We did have record of 2009 colonoscopy, endoscopically no active UC.  Random biopsies showed nonspecific chronic inflammation of the colon but no dysplasia.    Patient has noted some change in bowel habits over the past 6 months.  Has had more issues with constipation than normal for her.  She feels like her stools are not complete.  She has several bowel movements every morning.  Complains of postprandial urgency when she eats out.  Currently alternating magnesium with stool softener with laxative.  She is interested in pursuing colonoscopy which is not unreasonable given her change in bowel habits and history of UC.  However, she is in the middle of scheduling left knee replacement which she feels like is have a party for her given her amount of pain.  Recommend she call to schedule colonoscopy once her knee surgery has been completed.  She is aware that will take sometimes 4 to 6 weeks to get her on the schedule for colonoscopy.  In the interim, we will have her hold magnesium/stool softener with laxative.  Trial of low-dose Linzess 72 mcg daily.  Samples provided.  She will call if she has good results and would like a prescription.  Advised her to let us know if she has any rectal bleeding, abdominal pain/rectal pain, persistent diarrhea.

## 2020-07-27 NOTE — Patient Instructions (Signed)
1. Try Linzess once daily on empty stomach to assist with bowel movements. If stools too frequent, you can skip a day or two of medication and then resume. Hold magnesium and stool softener with laxative while on Linzess. 2. If you decide Linzess is helpful, call for prescription. 3. Call after your knee surgery when you are ready to schedule your colonoscopy.  4. We will try to get copy of your last colonoscopy for review.

## 2020-07-27 NOTE — Progress Notes (Signed)
Cc'ed to pcp °

## 2020-08-05 ENCOUNTER — Telehealth: Payer: Self-pay | Admitting: Gastroenterology

## 2020-08-05 NOTE — Telephone Encounter (Signed)
Records received from Dr. Barnabas Lister Spainhour  Colonoscopy June 2009: Surveillance for chronic ulcerative colitis.  No evidence of active ulcerative colitis.  Biopsies showed nonspecific mild chronic inflammation of the colon.  Colonoscopy April 2006: Indication for procedure, surveillance colonoscopy for ulcerative colitis.  Quiescent colitis.  Mild internal hemorrhoids.  Mild sigmoid diverticulosis.  Multiple biopsies taken.  Biopsy showed chronic inflammation of the colon consistent with quiescent ulcerative colitis.  Colonoscopy from February 2002: Indication, rectal bleeding and mucus.  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  Colonoscopy June 2013: Received pathology from June 2013 colonoscopy.  Unfortunately I did not get a copy of the colonoscopy report.  On random colon biopsies she had mild focal active colitis, inflammation nonspecific.  Compatible with quiescent ulcerative colitis.  Dysplastic changes not seen.

## 2020-08-17 ENCOUNTER — Other Ambulatory Visit: Payer: Self-pay

## 2020-08-17 NOTE — Progress Notes (Signed)
[place], [place] [address] Phone: [phone] Fax: [phone]      Your procedure is scheduled on Friday February 4th.  Report to [facility] Main Entrance "A" at 5:30 A.M., and check in at the Admitting office.  Call this number if you have problems the morning of surgery:  [phone]  Call [phone] if you have any questions prior to your surgery date Monday-Friday 8am-4pm    Remember:  Do not eat after midnight the night before your surgery  You may drink clear liquids until 4:30am the morning of your surgery.   Clear liquids allowed are: Water, Non-Citrus Juices (without pulp), Carbonated Beverages, Clear Tea, Black Coffee Only, and Gatorade    Take these medicines the morning of surgery with A SIP OF WATER   montelukast (SINGULAIR) 10 MG tablet  rosuvastatin (CRESTOR) 5 MG tablet  If Needed  cetirizine (ZYRTEC) 10 MG tablet     As of today, STOP taking any Aspirin (unless otherwise instructed by your surgeon) Aleve, Naproxen, Ibuprofen, Motrin, Advil, Goody's, BC's, all herbal medications, fish oil, and all vitamins.                     Do not wear jewelry, make up, or nail polish            Do not wear lotions, powders, perfumes, or deodorant.            Do not shave 48 hours prior to surgery.              Do not bring valuables to the hospital.            [facility] is not responsible for any belongings or valuables.  Do NOT Smoke (Tobacco/Vaping) or drink Alcohol 24 hours prior to your procedure If you use a CPAP at night, you may bring all equipment for your overnight stay.   Contacts, glasses, dentures or bridgework may not be worn into surgery.      For patients admitted to the hospital, discharge time will be determined by your treatment team.   Patients discharged the day of surgery will not be allowed to drive home, and someone needs to stay with them for 24 hours.    Special  instructions:   [place]- Preparing For Surgery  Before surgery, you can play an important role. Because skin is not sterile, your skin needs to be as free of germs as possible. You can reduce the number of germs on your skin by washing with CHG (chlorahexidine gluconate) Soap before surgery.  CHG is an antiseptic cleaner which kills germs and bonds with the skin to continue killing germs even after washing.    Oral Hygiene is also important to reduce your risk of infection.  Remember - BRUSH YOUR TEETH THE MORNING OF SURGERY WITH YOUR REGULAR TOOTHPASTE  Please do not use if you have an allergy to CHG or antibacterial soaps. If your skin becomes reddened/irritated stop using the CHG.  Do not shave (including legs and underarms) for at least 48 hours prior to first CHG shower. It is OK to shave your face.  Please follow these instructions carefully.   1. Shower the NIGHT BEFORE SURGERY and the MORNING OF SURGERY with CHG Soap.   2. If you chose to wash your hair, wash your hair first as usual with your normal shampoo.  3. After you shampoo, rinse your hair and  body thoroughly to remove the shampoo.  4. Use CHG as you would any other liquid soap. You can apply CHG directly to the skin and wash gently with a scrungie or a clean washcloth.   5. Apply the CHG Soap to your body ONLY FROM THE NECK DOWN.  Do not use on open wounds or open sores. Avoid contact with your eyes, ears, mouth and genitals (private parts). Wash Face and genitals (private parts)  with your normal soap.   6. Wash thoroughly, paying special attention to the area where your surgery will be performed.  7. Thoroughly rinse your body with warm water from the neck down.  8. DO NOT shower/wash with your normal soap after using and rinsing off the CHG Soap.  9. Pat yourself dry with a CLEAN TOWEL.  10. Wear CLEAN PAJAMAS to bed the night before surgery  11. Place CLEAN SHEETS on your bed the night of your first shower and  DO NOT SLEEP WITH PETS.   Day of Surgery: Wear Clean/Comfortable clothing the morning of surgery Do not apply any deodorants/lotions.   Remember to brush your teeth WITH YOUR REGULAR TOOTHPASTE.   Please read over the following fact sheets that you were given.

## 2020-08-18 ENCOUNTER — Other Ambulatory Visit: Payer: Self-pay

## 2020-08-18 ENCOUNTER — Encounter: Payer: Self-pay | Admitting: Surgery

## 2020-08-18 ENCOUNTER — Ambulatory Visit (INDEPENDENT_AMBULATORY_CARE_PROVIDER_SITE_OTHER): Payer: Medicare Other | Admitting: Surgery

## 2020-08-18 ENCOUNTER — Inpatient Hospital Stay (HOSPITAL_COMMUNITY)
Admission: RE | Admit: 2020-08-18 | Discharge: 2020-08-18 | Disposition: A | Discharge status: Home / Self Care | Payer: Medicare Other | Source: Ambulatory Visit

## 2020-08-18 DIAGNOSIS — M1712 Unilateral primary osteoarthritis, left knee: Secondary | ICD-10-CM

## 2020-08-18 NOTE — Progress Notes (Signed)
Pt called to inform her of surgery needing to be rescheduled. Directed her to call Dr. Lorin Mercy office for further instruction. Message left on voicemail.

## 2020-08-18 NOTE — Progress Notes (Signed)
75 year old white female history of end-stage DJD left knee pain came in today for preop evaluation for left total knee replacement.  Patient was advised by surgery scheduler and myself today she would be expected to discharge home the same day from the hospital.  Patient's husband also present during this visit.  Multiple questions were answered.  Have some concern about pain during the immediate postop period.  The like to postpone the surgery at least until we are able to keep her in the hospital for at least 24 hours postop to make sure that she has better pain management.  All questions were answered and patient and husband are both very appreciative with this visit.

## 2020-08-23 ENCOUNTER — Other Ambulatory Visit (HOSPITAL_COMMUNITY): Payer: Medicare Other

## 2020-09-01 ENCOUNTER — Encounter: Payer: Self-pay | Admitting: Surgery

## 2020-09-01 ENCOUNTER — Ambulatory Visit (INDEPENDENT_AMBULATORY_CARE_PROVIDER_SITE_OTHER): Payer: Medicare Other | Admitting: Surgery

## 2020-09-01 ENCOUNTER — Other Ambulatory Visit: Payer: Self-pay

## 2020-09-01 VITALS — BP 139/79 | HR 87 | Temp 97.9°F

## 2020-09-01 DIAGNOSIS — M1712 Unilateral primary osteoarthritis, left knee: Secondary | ICD-10-CM

## 2020-09-02 NOTE — Progress Notes (Signed)
Your procedure is scheduled on Wednesday, September 07, 2020 from 12:30PM - 2:33PM .  Report to [facility] Main Entrance "A" at 5:30 A.M., and check in at the Admitting office.  Call this number if you have problems the morning of surgery:  [phone]  Call [phone] if you have any questions prior to your surgery date Monday-Friday 8am-4pm    Remember:  Do not eat or drink after midnight the night before your surgery     Take these medicines the morning of surgery with A SIP OF WATER   montelukast (SINGULAIR)  rosuvastatin (CRESTOR)   cetirizine (ZYRTEC) - if needed    As of today, STOP taking any Aspirin (unless otherwise instructed by your surgeon) Aleve, Naproxen, Ibuprofen, Motrin, Advil, Goody's, BC's, all herbal medications, fish oil, and all vitamins.                     Do not wear jewelry, make up, or nail polish            Do not wear lotions, powders, perfumes, or deodorant.            Do not shave 48 hours prior to surgery.              Do not bring valuables to the hospital.            [facility] is not responsible for any belongings or valuables.  Do NOT Smoke (Tobacco/Vaping) or drink Alcohol 24 hours prior to your procedure If you use a CPAP at night, you may bring all equipment for your overnight stay.   Contacts, glasses, dentures or bridgework may not be worn into surgery.      For patients admitted to the hospital, discharge time will be determined by your treatment team.   Patients discharged the day of surgery will not be allowed to drive home, and someone needs to stay with them for 24 hours.    Special instructions:   [place]- Preparing For Surgery  Before surgery, you can play an important role. Because skin is not sterile, your skin needs to be as free of germs as possible. You can reduce the number of germs on your skin by washing with CHG (chlorahexidine gluconate) Soap before surgery.  CHG is an antiseptic cleaner which kills germs and  bonds with the skin to continue killing germs even after washing.    Oral Hygiene is also important to reduce your risk of infection.  Remember - BRUSH YOUR TEETH THE MORNING OF SURGERY WITH YOUR REGULAR TOOTHPASTE  Please do not use if you have an allergy to CHG or antibacterial soaps. If your skin becomes reddened/irritated stop using the CHG.  Do not shave (including legs and underarms) for at least 48 hours prior to first CHG shower. It is OK to shave your face.  Please follow these instructions carefully.   1. Shower the NIGHT BEFORE SURGERY and the MORNING OF SURGERY with CHG Soap.   2. If you chose to wash your hair, wash your hair first as usual with your normal shampoo.  3. After you shampoo, rinse your hair and body thoroughly to remove the shampoo.  4. Use CHG as you would any other liquid soap. You can apply CHG directly to the skin and wash gently with a scrungie or a clean washcloth.   5. Apply the CHG Soap to your body ONLY FROM THE NECK DOWN.  Do not use on open wounds or open  sores. Avoid contact with your eyes, ears, mouth and genitals (private parts). Wash Face and genitals (private parts)  with your normal soap.   6. Wash thoroughly, paying special attention to the area where your surgery will be performed.  7. Thoroughly rinse your body with warm water from the neck down.  8. DO NOT shower/wash with your normal soap after using and rinsing off the CHG Soap.  9. Pat yourself dry with a CLEAN TOWEL.  10. Wear CLEAN PAJAMAS to bed the night before surgery  11. Place CLEAN SHEETS on your bed the night of your first shower and DO NOT SLEEP WITH PETS.   Day of Surgery: Wear Clean/Comfortable clothing the morning of surgery Do not apply any deodorants/lotions.   Remember to brush your teeth WITH YOUR REGULAR TOOTHPASTE.   Please read over the following fact sheets that you were given.

## 2020-09-05 ENCOUNTER — Encounter (HOSPITAL_COMMUNITY): Payer: Self-pay

## 2020-09-05 ENCOUNTER — Other Ambulatory Visit (HOSPITAL_COMMUNITY)
Admission: RE | Admit: 2020-09-05 | Discharge: 2020-09-05 | Disposition: A | Discharge status: Home / Self Care | Payer: Medicare Other | Source: Ambulatory Visit | Attending: Orthopaedic Surgery | Admitting: Orthopaedic Surgery

## 2020-09-05 ENCOUNTER — Other Ambulatory Visit: Payer: Self-pay

## 2020-09-05 ENCOUNTER — Encounter (HOSPITAL_COMMUNITY)
Admission: RE | Admit: 2020-09-05 | Discharge: 2020-09-05 | Disposition: A | Discharge status: Home / Self Care | Payer: Medicare Other | Source: Ambulatory Visit | Attending: Orthopaedic Surgery | Admitting: Orthopaedic Surgery

## 2020-09-05 DIAGNOSIS — Z01812 Encounter for preprocedural laboratory examination: Secondary | ICD-10-CM | POA: Insufficient documentation

## 2020-09-05 DIAGNOSIS — Z20822 Contact with and (suspected) exposure to covid-19: Secondary | ICD-10-CM | POA: Insufficient documentation

## 2020-09-05 HISTORY — DX: Nausea with vomiting, unspecified: R11.2

## 2020-09-05 HISTORY — DX: Cardiac murmur, unspecified: R01.1

## 2020-09-05 HISTORY — DX: Other specified postprocedural states: Z98.890

## 2020-09-05 HISTORY — DX: Anxiety disorder, unspecified: F41.9

## 2020-09-05 HISTORY — DX: Anemia, unspecified: D64.9

## 2020-09-05 HISTORY — DX: Irritable bowel syndrome, unspecified: K58.9

## 2020-09-05 HISTORY — DX: Personal history of other mental and behavioral disorders: Z86.59

## 2020-09-05 HISTORY — DX: Unspecified osteoarthritis, unspecified site: M19.90

## 2020-09-05 LAB — CBC
HCT: 47.6 % — ABNORMAL HIGH (ref 36.0–46.0)
Hemoglobin: 15.3 g/dL — ABNORMAL HIGH (ref 12.0–15.0)
MCH: 30.8 pg (ref 26.0–34.0)
MCHC: 32.1 g/dL (ref 30.0–36.0)
MCV: 95.8 fL (ref 80.0–100.0)
Platelets: 272 10*3/uL (ref 150–400)
RBC: 4.97 MIL/uL (ref 3.87–5.11)
RDW: 12.2 % (ref 11.5–15.5)
WBC: 7 10*3/uL (ref 4.0–10.5)
nRBC: 0 % (ref 0.0–0.2)

## 2020-09-05 LAB — SURGICAL PCR SCREEN
MRSA, PCR: NEGATIVE
Staphylococcus aureus: NEGATIVE

## 2020-09-05 NOTE — Pre-Procedure Instructions (Signed)
Carol Roach  09/05/2020    Your procedure is scheduled on Wednesday, September 07, 2020 at 12:30 PM.   Report to [facility] Entrance "A" Admitting Office at 10:30 AM.   Call this number if you have problems the morning of surgery: [phone]   Questions prior to day of surgery, please call [phone] between 8 & 4 PM.   Remember:  Do not eat food after midnight Tuesday, 09/06/20.  You may drink clear liquids until 9:30 AM.  Clear liquids allowed are:  Water, Juice (non-citric and without pulp - diabetics please choose diet or no sugar options), Carbonated beverages - (diabetics please choose diet or no sugar options), Clear Tea, Black Coffee only (no creamer, milk or cream including half and half) and Gatorade (diabetics please choose diet or no sugar options)    Take these medicines the morning of surgery with A SIP OF WATER: Montelukast (Singulair), Rosuvastatin (Crestor), Cetirizine (Zyrtec) - if needed.  Stop Vitamins and NSAIDS (Aleve, Naprosyn, Ibuprofen, etc) as of today prior to surgery. Do not use Aspirin containing products (BC Powders, Goody's, etc), Fish Oil or Herbal medications prior to surgery.    Do not wear jewelry, make-up or nail polish.  Do not wear lotions, powders, perfumes or deodorant.  Do not shave 48 hours prior to surgery.    Do not bring valuables to the hospital.  [facility] is not responsible for any belongings or valuables.  Contacts, dentures or bridgework may not be worn into surgery.  Leave your suitcase in the car.  After surgery it may be brought to your room.  For patients admitted to the hospital, discharge time will be determined by your treatment team.  Patients discharged the day of surgery will not be allowed to drive home.   [place] - Preparing for Surgery  Before surgery, you can play an important role.  Because skin is not sterile, your skin needs to be as free of germs as possible.  You can reduce the number of germs  on you skin by washing with CHG (chlorahexidine gluconate) soap before surgery.  CHG is an antiseptic cleaner which kills germs and bonds with the skin to continue killing germs even after washing.  Oral Hygiene is also important in reducing the risk of infection.  Remember to brush your teeth with your regular toothpaste the morning of surgery.  Please DO NOT use if you have an allergy to CHG or antibacterial soaps.  If your skin becomes reddened/irritated stop using the CHG and inform your nurse when you arrive at Short Stay.  Do not shave (including legs and underarms) for at least 48 hours prior to the first CHG shower.  You may shave your face.  Please follow these instructions carefully:   1.  Shower with CHG Soap the night before surgery and the morning of Surgery.  2.  If you choose to wash your hair, wash your hair first as usual with your normal shampoo.  3.  After you shampoo, rinse your hair and body thoroughly to remove the shampoo. 4.  Use CHG as you would any other liquid soap.  You can apply chg directly to the skin and wash gently with a      scrungie or washcloth.           5.  Apply the CHG Soap to your body ONLY FROM THE NECK DOWN.   Do not use on open wounds or open sores. Avoid contact with your eyes,  ears, mouth and genitals (private parts).  Wash genitals (private parts) with your normal soap - do this prior to using CHG soap.  6.  Wash thoroughly, paying special attention to the area where your surgery will be performed.  7.  Thoroughly rinse your body with warm water from the neck down.  8.  DO NOT shower/wash with your normal soap after using and rinsing off the CHG Soap.  9.  Pat yourself dry with a clean towel.            10.  Wear clean pajamas.            11.  Place clean sheets on your bed the night of your first shower and do not sleep with pets.  Day of Surgery  Shower as above. Do not apply any lotions/deodrants the morning of surgery.   Please wear clean  clothes to the hospital. Remember to brush your teeth with toothpaste.  Please read over the fact sheets that you were given.

## 2020-09-05 NOTE — Progress Notes (Signed)
PCP - Dr. Garey Ham Cardiologist - Dr. Darral Dash (no heart problems, just sees him because her previous PCP was a cardiologist and when he retired, she started going to Dr. Darral Dash). Hx of a heart murmur only   ECHO - 2021 (have requested records from [facility]) [place] - per Anesthesia protocol    COVID TEST- scheduled for today   Anesthesia review: yes - have requested clearance notes from Dr. Lorin Mercy office.  Patient denies shortness of breath, fever, cough and chest pain at PAT appointment   All instructions explained to the patient, with a verbal understanding of the material. Patient agrees to go over the instructions while at home for a better understanding. Patient also instructed to self quarantine after being tested for COVID-19. The opportunity to ask questions was provided.

## 2020-09-06 ENCOUNTER — Telehealth: Payer: Self-pay | Admitting: *Deleted

## 2020-09-06 LAB — SARS CORONAVIRUS 2 (TAT 6-24 HRS): SARS Coronavirus 2: NEGATIVE

## 2020-09-06 NOTE — Anesthesia Preprocedure Evaluation (Addendum)
Anesthesia Evaluation  Patient identified by MRN, date of birth, ID band Patient awake    Reviewed: Allergy & Precautions, NPO status , Patient's Chart, lab work & pertinent test results  History of Anesthesia Complications Negative for: history of anesthetic complications  Airway Mallampati: II  TM Distance: >3 FB Neck ROM: Full    Dental no notable dental hx.    Pulmonary asthma ,    Pulmonary exam normal        Cardiovascular negative cardio ROS Normal cardiovascular exam     Neuro/Psych Anxiety negative neurological ROS     GI/Hepatic Neg liver ROS, PUD,   Endo/Other  negative endocrine ROS  Renal/GU negative Renal ROS  negative genitourinary   Musculoskeletal negative musculoskeletal ROS (+)   Abdominal   Peds  Hematology negative hematology ROS (+)   Anesthesia Other Findings Day of surgery medications reviewed with patient.  Reproductive/Obstetrics negative OB ROS                             Anesthesia Physical Anesthesia Plan  ASA: II  Anesthesia Plan: Spinal   Post-op Pain Management:  Regional for Post-op pain   Induction:   PONV Risk Score and Plan: 3 and Treatment may vary due to age or medical condition, Ondansetron, Propofol infusion and Dexamethasone  Airway Management Planned: Natural Airway and Simple Face Mask  Additional Equipment: None  Intra-op Plan:   Post-operative Plan:   Informed Consent: I have reviewed the patients History and Physical, chart, labs and discussed the procedure including the risks, benefits and alternatives for the proposed anesthesia with the patient or authorized representative who has indicated his/her understanding and acceptance.     Dental advisory given  Plan Discussed with: CRNA  Anesthesia Plan Comments: (Follows with pulmonologist Dr. Davina Poke for history of allergic rhinitis and abnormal PFTs (small airway  obstruction).  Last seen 06/08/2020, per note her allergic rhinitis was significantly improved after completion of immunotherapy.  She was recommended follow-up in 1 year.  Preop labs reviewed, unremarkable.   TTE 08/20/2019 (copy on chart): Impression: 1.  Normal LV size with normal systolic function.  Ejection fraction is visually estimated at 60% and measured by BPS of 57%.  There is normal left ventricular wall thickness.  LV diastolic filling pattern is grossly normal with probably normal LV filling pressure.  Normal LA size and normal RVSP. 2.  Poorly seen right ventricle.  Grossly normal contractility and low estimated RA pressure with complete inspiratory collapse of the IVC. 3.  All cardiac valves are poorly visualized.  No gross structural or functional abnormality noted. 4.  Very poor technical quality, presumably due to advanced COPD.)       Anesthesia Quick Evaluation

## 2020-09-06 NOTE — Progress Notes (Signed)
Anesthesia Chart Review:  Follows with pulmonologist Dr. Davina Poke for history of allergic rhinitis and abnormal PFTs (small airway obstruction).  Last seen 06/08/2020, per note her allergic rhinitis was significantly improved after completion of immunotherapy.  She was recommended follow-up in 1 year.  Preop labs reviewed, unremarkable.   TTE 08/20/2019 (copy on chart): Impression: 1.  Normal LV size with normal systolic function.  Ejection fraction is visually estimated at 60% and measured by BPS of 57%.  There is normal left ventricular wall thickness.  LV diastolic filling pattern is grossly normal with probably normal LV filling pressure.  Normal LA size and normal RVSP. 2.  Poorly seen right ventricle.  Grossly normal contractility and low estimated RA pressure with complete inspiratory collapse of the IVC. 3.  All cardiac valves are poorly visualized.  No gross structural or functional abnormality noted. 4.  Very poor technical quality, presumably due to advanced COPD.   Carol Roach [facility] Stay Center/Anesthesiology Phone [phone] 09/06/2020 10:08 AM

## 2020-09-06 NOTE — Telephone Encounter (Signed)
[facility] RN Case manager assisted with setting up HHPT, OPPT for after Left total knee arthroplasty with Dr. Lorin Mercy on 09/07/20. Referral made to [facility] in the [place] area.Phone [phone], fax # [phone]. OPPT scheduled for 09/22/20 at 10:00 am with [facility] in [place] area. Please fax discharge instructions and orders to [facility] upon discharge.

## 2020-09-07 ENCOUNTER — Encounter (HOSPITAL_COMMUNITY): Payer: Self-pay | Admitting: Orthopaedic Surgery

## 2020-09-07 ENCOUNTER — Ambulatory Visit (HOSPITAL_COMMUNITY): Payer: Medicare Other | Admitting: Physician Assistant

## 2020-09-07 ENCOUNTER — Ambulatory Visit (HOSPITAL_COMMUNITY): Payer: Medicare Other | Admitting: Vascular Surgery

## 2020-09-07 ENCOUNTER — Observation Stay (HOSPITAL_COMMUNITY): Payer: Medicare Other

## 2020-09-07 ENCOUNTER — Encounter (HOSPITAL_COMMUNITY)
Admission: AD | Disposition: A | Discharge status: Home / Self Care | Payer: Self-pay | Source: Home / Self Care | Attending: Orthopaedic Surgery

## 2020-09-07 ENCOUNTER — Other Ambulatory Visit: Payer: Self-pay

## 2020-09-07 ENCOUNTER — Inpatient Hospital Stay (HOSPITAL_COMMUNITY)
Admission: AD | Admit: 2020-09-07 | Discharge: 2020-09-09 | DRG: 470 | Disposition: A | Discharge status: Home / Self Care | Payer: Medicare Other | Attending: Orthopaedic Surgery | Admitting: Orthopaedic Surgery

## 2020-09-07 DIAGNOSIS — E785 Hyperlipidemia, unspecified: Secondary | ICD-10-CM | POA: Diagnosis present

## 2020-09-07 DIAGNOSIS — M1712 Unilateral primary osteoarthritis, left knee: Principal | ICD-10-CM

## 2020-09-07 DIAGNOSIS — Z79899 Other long term (current) drug therapy: Secondary | ICD-10-CM

## 2020-09-07 DIAGNOSIS — Z91040 Latex allergy status: Secondary | ICD-10-CM

## 2020-09-07 DIAGNOSIS — Z96659 Presence of unspecified artificial knee joint: Secondary | ICD-10-CM

## 2020-09-07 DIAGNOSIS — Z09 Encounter for follow-up examination after completed treatment for conditions other than malignant neoplasm: Secondary | ICD-10-CM

## 2020-09-07 DIAGNOSIS — Z20822 Contact with and (suspected) exposure to covid-19: Secondary | ICD-10-CM | POA: Diagnosis present

## 2020-09-07 DIAGNOSIS — Z885 Allergy status to narcotic agent status: Secondary | ICD-10-CM

## 2020-09-07 DIAGNOSIS — J45909 Unspecified asthma, uncomplicated: Secondary | ICD-10-CM | POA: Diagnosis present

## 2020-09-07 DIAGNOSIS — Z85828 Personal history of other malignant neoplasm of skin: Secondary | ICD-10-CM

## 2020-09-07 HISTORY — PX: TOTAL KNEE ARTHROPLASTY: SHX125

## 2020-09-07 LAB — BASIC METABOLIC PANEL
Anion gap: 11 (ref 5–15)
BUN: 12 mg/dL (ref 8–23)
CO2: 23 mmol/L (ref 22–32)
Calcium: 9.4 mg/dL (ref 8.9–10.3)
Chloride: 106 mmol/L (ref 98–111)
Creatinine, Ser: 0.74 mg/dL (ref 0.44–1.00)
GFR, Estimated: >60 mL/min (ref >60)
Glucose, Bld: 84 mg/dL (ref 70–99)
Potassium: 3.9 mmol/L (ref 3.5–5.1)
Sodium: 140 mmol/L (ref 135–145)

## 2020-09-07 SURGERY — ARTHROPLASTY, KNEE, TOTAL
Anesthesia: Spinal | Site: Knee | Laterality: Left

## 2020-09-07 MED ORDER — MIDAZOLAM HCL 2 MG/2ML IJ SOLN
INTRAMUSCULAR | Status: AC
  Filled 2020-09-07: qty 2

## 2020-09-07 MED ORDER — METHOCARBAMOL 500 MG PO TABS
500.0000 mg | ORAL_TABLET | Freq: Four times a day (QID) | ORAL | Status: DC | PRN
  Administered 2020-09-07 – 2020-09-08 (×5): 500 mg via ORAL
  Filled 2020-09-07 (×4): qty 1

## 2020-09-07 MED ORDER — TRANEXAMIC ACID 1000 MG/10ML IV SOLN
2000.0000 mg | INTRAVENOUS | Status: DC
  Filled 2020-09-07: qty 20

## 2020-09-07 MED ORDER — PHENOL 1.4 % MT LIQD: 1.0000 | OROMUCOSAL | Status: DC | PRN

## 2020-09-07 MED ORDER — LIDOCAINE 2% (20 MG/ML) 5 ML SYRINGE
INTRAMUSCULAR | Status: DC | PRN
  Administered 2020-09-07: 60 mg via INTRAVENOUS

## 2020-09-07 MED ORDER — CEFAZOLIN SODIUM-DEXTROSE 1-4 GM/50ML-% IV SOLN
1.0000 g | Freq: Three times a day (TID) | INTRAVENOUS | Status: AC
  Administered 2020-09-07 – 2020-09-08 (×2): 1 g via INTRAVENOUS
  Filled 2020-09-07 (×2): qty 50

## 2020-09-07 MED ORDER — BUPIVACAINE LIPOSOME 1.3 % IJ SUSP
20.0000 mL | Freq: Once | INTRAMUSCULAR | Status: DC
  Filled 2020-09-07: qty 20

## 2020-09-07 MED ORDER — CEFAZOLIN SODIUM-DEXTROSE 2-4 GM/100ML-% IV SOLN
2.0000 g | INTRAVENOUS | Status: AC
  Administered 2020-09-07: 2 g via INTRAVENOUS
  Filled 2020-09-07: qty 100

## 2020-09-07 MED ORDER — CALCIUM CARBONATE-VITAMIN D 500-200 MG-UNIT PO TABS
1.0000 | ORAL_TABLET | Freq: Every day | ORAL | Status: DC
  Administered 2020-09-08 – 2020-09-09 (×2): 1 via ORAL
  Filled 2020-09-07 (×2): qty 1

## 2020-09-07 MED ORDER — BUPIVACAINE HCL (PF) 0.25 % IJ SOLN
INTRAMUSCULAR | Status: AC
  Filled 2020-09-07: qty 30

## 2020-09-07 MED ORDER — CLONIDINE HCL (ANALGESIA) 100 MCG/ML EP SOLN
EPIDURAL | Status: DC | PRN
  Administered 2020-09-07: 100 ug

## 2020-09-07 MED ORDER — TRANEXAMIC ACID-NACL 1000-0.7 MG/100ML-% IV SOLN
INTRAVENOUS | Status: AC
  Filled 2020-09-07: qty 100

## 2020-09-07 MED ORDER — OXYCODONE HCL 5 MG PO TABS
5.0000 mg | ORAL_TABLET | ORAL | Status: DC | PRN
  Administered 2020-09-07 – 2020-09-08 (×2): 5 mg via ORAL
  Filled 2020-09-07 (×2): qty 1

## 2020-09-07 MED ORDER — ONDANSETRON HCL 4 MG PO TABS: 4.0000 mg | ORAL_TABLET | Freq: Four times a day (QID) | ORAL | Status: DC | PRN

## 2020-09-07 MED ORDER — LIDOCAINE 2% (20 MG/ML) 5 ML SYRINGE
INTRAMUSCULAR | Status: AC
  Filled 2020-09-07: qty 5

## 2020-09-07 MED ORDER — CHLORHEXIDINE GLUCONATE 0.12 % MT SOLN
15.0000 mL | Freq: Once | OROMUCOSAL | Status: AC
  Administered 2020-09-07: 15 mL via OROMUCOSAL
  Filled 2020-09-07: qty 15

## 2020-09-07 MED ORDER — MENTHOL 3 MG MT LOZG: 1.0000 | LOZENGE | OROMUCOSAL | Status: DC | PRN

## 2020-09-07 MED ORDER — ASPIRIN EC 325 MG PO TBEC
325.0000 mg | DELAYED_RELEASE_TABLET | Freq: Every day | ORAL | 0 refills | Status: DC
Start: 2020-09-07 — End: 2021-10-03

## 2020-09-07 MED ORDER — LORATADINE 10 MG PO TABS: 10.0000 mg | ORAL_TABLET | Freq: Every day | ORAL | Status: DC

## 2020-09-07 MED ORDER — VITAMIN D 25 MCG (1000 UNIT) PO TABS
2000.0000 [IU] | ORAL_TABLET | Freq: Every day | ORAL | Status: DC
  Administered 2020-09-08 – 2020-09-09 (×2): 2000 [IU] via ORAL
  Filled 2020-09-07 (×2): qty 2

## 2020-09-07 MED ORDER — METOCLOPRAMIDE HCL 5 MG/ML IJ SOLN: 5.0000 mg | Freq: Three times a day (TID) | INTRAMUSCULAR | Status: DC | PRN

## 2020-09-07 MED ORDER — BUPIVACAINE LIPOSOME 1.3 % IJ SUSP
INTRAMUSCULAR | Status: DC | PRN
  Administered 2020-09-07: 20 mL

## 2020-09-07 MED ORDER — SODIUM CHLORIDE 0.9 % IR SOLN
Status: DC | PRN
  Administered 2020-09-07: 3000 mL

## 2020-09-07 MED ORDER — ORAL CARE MOUTH RINSE: 15.0000 mL | Freq: Once | OROMUCOSAL | Status: AC

## 2020-09-07 MED ORDER — ONDANSETRON HCL 4 MG/2ML IJ SOLN
INTRAMUSCULAR | Status: DC | PRN
  Administered 2020-09-07: 4 mg via INTRAVENOUS

## 2020-09-07 MED ORDER — ACETAMINOPHEN 325 MG PO TABS
325.0000 mg | ORAL_TABLET | Freq: Four times a day (QID) | ORAL | Status: DC | PRN
  Administered 2020-09-07 – 2020-09-09 (×3): 650 mg via ORAL
  Filled 2020-09-07 (×3): qty 2

## 2020-09-07 MED ORDER — LACTATED RINGERS IV SOLN: INTRAVENOUS | Status: DC

## 2020-09-07 MED ORDER — BUPIVACAINE-EPINEPHRINE (PF) 0.5% -1:200000 IJ SOLN
INTRAMUSCULAR | Status: DC | PRN
  Administered 2020-09-07: 15 mL via PERINEURAL

## 2020-09-07 MED ORDER — BUPIVACAINE IN DEXTROSE 0.75-8.25 % IT SOLN
INTRATHECAL | Status: DC | PRN
  Administered 2020-09-07: 1.6 mL via INTRATHECAL

## 2020-09-07 MED ORDER — DOCUSATE SODIUM 100 MG PO CAPS
100.0000 mg | ORAL_CAPSULE | Freq: Two times a day (BID) | ORAL | Status: DC
  Administered 2020-09-07 – 2020-09-09 (×4): 100 mg via ORAL
  Filled 2020-09-07 (×4): qty 1

## 2020-09-07 MED ORDER — POLYETHYLENE GLYCOL 3350 17 G PO PACK: 17.0000 g | PACK | Freq: Every day | ORAL | Status: DC | PRN

## 2020-09-07 MED ORDER — MONTELUKAST SODIUM 10 MG PO TABS
10.0000 mg | ORAL_TABLET | Freq: Every day | ORAL | Status: DC
  Administered 2020-09-08 – 2020-09-09 (×2): 10 mg via ORAL
  Filled 2020-09-07 (×2): qty 1

## 2020-09-07 MED ORDER — FENTANYL CITRATE (PF) 100 MCG/2ML IJ SOLN: 50.0000 ug | Freq: Once | INTRAMUSCULAR | Status: AC

## 2020-09-07 MED ORDER — METHOCARBAMOL 500 MG PO TABS: 500.0000 mg | ORAL_TABLET | Freq: Four times a day (QID) | ORAL | 0 refills | Status: DC | PRN

## 2020-09-07 MED ORDER — PHENYLEPHRINE HCL-NACL 10-0.9 MG/250ML-% IV SOLN
INTRAVENOUS | Status: DC | PRN
  Administered 2020-09-07: 40 ug/min via INTRAVENOUS

## 2020-09-07 MED ORDER — EPHEDRINE SULFATE 50 MG/ML IJ SOLN
INTRAMUSCULAR | Status: DC | PRN
  Administered 2020-09-07 (×2): 5 mg via INTRAVENOUS

## 2020-09-07 MED ORDER — TRANEXAMIC ACID-NACL 1000-0.7 MG/100ML-% IV SOLN
INTRAVENOUS | Status: DC | PRN
  Administered 2020-09-07: 1000 mg via INTRAVENOUS

## 2020-09-07 MED ORDER — FENTANYL CITRATE (PF) 100 MCG/2ML IJ SOLN
INTRAMUSCULAR | Status: AC
  Administered 2020-09-07: 50 ug via INTRAVENOUS
  Filled 2020-09-07: qty 2

## 2020-09-07 MED ORDER — METOCLOPRAMIDE HCL 5 MG PO TABS
5.0000 mg | ORAL_TABLET | Freq: Three times a day (TID) | ORAL | Status: DC | PRN
  Filled 2020-09-07: qty 2

## 2020-09-07 MED ORDER — DEXAMETHASONE SODIUM PHOSPHATE 10 MG/ML IJ SOLN
INTRAMUSCULAR | Status: DC | PRN
  Administered 2020-09-07: 4 mg via INTRAVENOUS

## 2020-09-07 MED ORDER — ASPIRIN EC 325 MG PO TBEC
325.0000 mg | DELAYED_RELEASE_TABLET | Freq: Every day | ORAL | Status: DC
  Administered 2020-09-08 – 2020-09-09 (×2): 325 mg via ORAL
  Filled 2020-09-07 (×3): qty 1

## 2020-09-07 MED ORDER — METHOCARBAMOL 500 MG PO TABS
ORAL_TABLET | ORAL | Status: AC
  Filled 2020-09-07: qty 1

## 2020-09-07 MED ORDER — ROSUVASTATIN CALCIUM 5 MG PO TABS
5.0000 mg | ORAL_TABLET | Freq: Every day | ORAL | Status: DC
Start: 2020-09-07 — End: 2020-09-09
  Administered 2020-09-07 – 2020-09-09 (×3): 5 mg via ORAL
  Filled 2020-09-07 (×3): qty 1

## 2020-09-07 MED ORDER — OXYCODONE-ACETAMINOPHEN 5-325 MG PO TABS
1.0000 | ORAL_TABLET | Freq: Four times a day (QID) | ORAL | 0 refills | Status: DC | PRN
Start: 2020-09-07 — End: 2020-09-08

## 2020-09-07 MED ORDER — BUPIVACAINE HCL (PF) 0.25 % IJ SOLN
INTRAMUSCULAR | Status: DC | PRN
  Administered 2020-09-07: 20 mL

## 2020-09-07 MED ORDER — METHOCARBAMOL 1000 MG/10ML IJ SOLN
500.0000 mg | Freq: Four times a day (QID) | INTRAVENOUS | Status: DC | PRN
  Filled 2020-09-07: qty 5

## 2020-09-07 MED ORDER — ONDANSETRON HCL 4 MG/2ML IJ SOLN: 4.0000 mg | Freq: Four times a day (QID) | INTRAMUSCULAR | Status: DC | PRN

## 2020-09-07 MED ORDER — HYDROMORPHONE HCL 1 MG/ML IJ SOLN
0.5000 mg | INTRAMUSCULAR | Status: DC | PRN
  Administered 2020-09-07: 0.5 mg via INTRAVENOUS
  Filled 2020-09-07: qty 0.5

## 2020-09-07 MED ORDER — PROPOFOL 500 MG/50ML IV EMUL
INTRAVENOUS | Status: DC | PRN
  Administered 2020-09-07: 50 ug/kg/min via INTRAVENOUS

## 2020-09-07 MED ORDER — SODIUM CHLORIDE 0.9 % IV SOLN: INTRAVENOUS | Status: DC

## 2020-09-07 MED ORDER — ONDANSETRON HCL 4 MG/2ML IJ SOLN
INTRAMUSCULAR | Status: AC
  Filled 2020-09-07: qty 2

## 2020-09-07 MED ORDER — PROPOFOL 10 MG/ML IV BOLUS
INTRAVENOUS | Status: AC
  Filled 2020-09-07: qty 20

## 2020-09-07 SURGICAL SUPPLY — 72 items
ATTUNE PS FEM LT SZ 6 CEM KNEE (Femur) ×2 IMPLANT
ATTUNE PSRP INSR SZ6 5 KNEE (Insert) ×2 IMPLANT
BANDAGE ESMARK 6X9 LF (GAUZE/BANDAGES/DRESSINGS) ×1 IMPLANT
BASE TIBIAL ROT PLAT SZ 5 KNEE (Knees) ×1 IMPLANT
BENZOIN TINCTURE PRP APPL 2/3 (GAUZE/BANDAGES/DRESSINGS) ×2 IMPLANT
BLADE SAGITTAL 25.0X1.19X90 (BLADE) ×2 IMPLANT
BLADE SAW SGTL 11.0X1.19X90.0M (BLADE) ×2 IMPLANT
BLADE SAW SGTL 13X75X1.27 (BLADE) ×2 IMPLANT
BNDG ELASTIC 4X5.8 VLCR STR LF (GAUZE/BANDAGES/DRESSINGS) ×2 IMPLANT
BNDG ELASTIC 6X5.8 VLCR STR LF (GAUZE/BANDAGES/DRESSINGS) ×2 IMPLANT
BNDG ESMARK 6X9 LF (GAUZE/BANDAGES/DRESSINGS) ×2
BOWL SMART MIX CTS (DISPOSABLE) ×2 IMPLANT
CEMENT HV SMART SET (Cement) ×4 IMPLANT
COVER SURGICAL LIGHT HANDLE (MISCELLANEOUS) ×2 IMPLANT
COVER WAND RF STERILE (DRAPES) ×2 IMPLANT
CUFF TOURN SGL QUICK 34 (TOURNIQUET CUFF) ×1
CUFF TOURN SGL QUICK 42 (TOURNIQUET CUFF) IMPLANT
CUFF TRNQT CYL 34X4.125X (TOURNIQUET CUFF) ×1 IMPLANT
DRAPE ORTHO SPLIT 77X108 STRL (DRAPES) ×2
DRAPE SURG ORHT 6 SPLT 77X108 (DRAPES) ×2 IMPLANT
DRAPE U-SHAPE 47X51 STRL (DRAPES) ×2 IMPLANT
DRSG MEPILEX BORDER 4X12 (GAUZE/BANDAGES/DRESSINGS) ×2 IMPLANT
DRSG PAD ABDOMINAL 8X10 ST (GAUZE/BANDAGES/DRESSINGS) ×2 IMPLANT
DURAPREP 26ML APPLICATOR (WOUND CARE) ×4 IMPLANT
ELECT REM PT RETURN 9FT ADLT (ELECTROSURGICAL) ×2
ELECTRODE REM PT RTRN 9FT ADLT (ELECTROSURGICAL) ×1 IMPLANT
EVACUATOR 1/8 PVC DRAIN (DRAIN) IMPLANT
FACESHIELD WRAPAROUND (MASK) ×4 IMPLANT
GAUZE SPONGE 4X4 12PLY STRL (GAUZE/BANDAGES/DRESSINGS) ×2 IMPLANT
GAUZE XEROFORM 5X9 LF (GAUZE/BANDAGES/DRESSINGS) ×2 IMPLANT
GLOVE ORTHO TXT STRL SZ7.5 (GLOVE) ×4 IMPLANT
GLOVE SRG 8 PF TXTR STRL LF DI (GLOVE) ×2 IMPLANT
GLOVE SURG UNDER POLY LF SZ8 (GLOVE) ×2
GOWN STRL REUS W/ TWL LRG LVL3 (GOWN DISPOSABLE) ×1 IMPLANT
GOWN STRL REUS W/ TWL XL LVL3 (GOWN DISPOSABLE) ×1 IMPLANT
GOWN STRL REUS W/TWL 2XL LVL3 (GOWN DISPOSABLE) ×2 IMPLANT
GOWN STRL REUS W/TWL LRG LVL3 (GOWN DISPOSABLE) ×1
GOWN STRL REUS W/TWL XL LVL3 (GOWN DISPOSABLE) ×1
HANDPIECE INTERPULSE COAX TIP (DISPOSABLE) ×1
IMMOBILIZER KNEE 22 UNIV (SOFTGOODS) ×2 IMPLANT
KIT BASIN OR (CUSTOM PROCEDURE TRAY) ×2 IMPLANT
KIT TURNOVER KIT B (KITS) ×2 IMPLANT
MANIFOLD NEPTUNE II (INSTRUMENTS) ×2 IMPLANT
MARKER SKIN DUAL TIP RULER LAB (MISCELLANEOUS) ×2 IMPLANT
NEEDLE 18GX1X1/2 (RX/OR ONLY) (NEEDLE) ×2 IMPLANT
NEEDLE HYPO 25GX1X1/2 BEV (NEEDLE) ×2 IMPLANT
NS IRRIG 1000ML POUR BTL (IV SOLUTION) ×2 IMPLANT
PACK TOTAL JOINT (CUSTOM PROCEDURE TRAY) ×2 IMPLANT
PAD ARMBOARD 7.5X6 YLW CONV (MISCELLANEOUS) ×4 IMPLANT
PAD CAST 4YDX4 CTTN HI CHSV (CAST SUPPLIES) ×1 IMPLANT
PADDING CAST COTTON 4X4 STRL (CAST SUPPLIES) ×1
PADDING CAST COTTON 6X4 STRL (CAST SUPPLIES) ×2 IMPLANT
PATELLA MEDIAL ATTUN 35MM KNEE (Knees) ×2 IMPLANT
PIN DRILL FIX HALF THREAD (BIT) ×2 IMPLANT
PIN STEINMAN FIXATION KNEE (PIN) ×2 IMPLANT
SET HNDPC FAN SPRY TIP SCT (DISPOSABLE) ×1 IMPLANT
STAPLER VISISTAT 35W (STAPLE) IMPLANT
SUCTION FRAZIER HANDLE 10FR (MISCELLANEOUS) ×1
SUCTION TUBE FRAZIER 10FR DISP (MISCELLANEOUS) ×1 IMPLANT
SUT VIC AB 0 CT1 27 (SUTURE) ×1
SUT VIC AB 0 CT1 27XBRD ANBCTR (SUTURE) ×1 IMPLANT
SUT VIC AB 1 CTX 36 (SUTURE) ×2
SUT VIC AB 1 CTX36XBRD ANBCTR (SUTURE) ×2 IMPLANT
SUT VIC AB 2-0 CT1 27 (SUTURE) ×2
SUT VIC AB 2-0 CT1 TAPERPNT 27 (SUTURE) ×2 IMPLANT
SUT VIC AB 3-0 X1 27 (SUTURE) ×2 IMPLANT
SYR 50ML LL SCALE MARK (SYRINGE) ×2 IMPLANT
SYR CONTROL 10ML LL (SYRINGE) ×2 IMPLANT
TIBIAL BASE ROT PLAT SZ 5 KNEE (Knees) ×2 IMPLANT
TOWEL GREEN STERILE (TOWEL DISPOSABLE) ×2 IMPLANT
TOWEL GREEN STERILE FF (TOWEL DISPOSABLE) ×2 IMPLANT
TRAY CATH 16FR W/PLASTIC CATH (SET/KITS/TRAYS/PACK) IMPLANT

## 2020-09-07 NOTE — H&P (Signed)
TOTAL KNEE ADMISSION H&P  Patient is being admitted for left total knee arthroplasty.  Subjective:  Chief Complaint:left knee pain.  HPI: Carol Roach, 75 y.o. female, has a history of pain and functional disability in the left knee due to arthritis and has failed non-surgical conservative treatments for greater than 12 weeks to includeNSAID's and/or analgesics, corticosteriod injections and activity modification.  Onset of symptoms was gradual, starting 10 years ago with gradually worsening course since that time.  Patient currently rates pain in the left knee(s) at 10 out of 10 with activity. Patient has night pain, worsening of pain with activity and weight bearing, pain that interferes with activities of daily living, pain with passive range of motion, crepitus and joint swelling.  Patient has evidence of subchondral sclerosis, periarticular osteophytes and joint space narrowing by imaging studies.  There is no active infection.  Patient Active Problem List   Diagnosis Date Noted  . Colitis   . Constipation   . Unilateral primary osteoarthritis, left knee 06/04/2019   Past Medical History:  Diagnosis Date  . Anemia    "long time ago"  . Anxiety   . Arthritis   . Asthma   . 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 BCC (basal cell carcinoma of skin) 07/01/2007   Right Lower Med Eyelid  . Colitis    Years ago  . Heart murmur   . History of bronchoscopy   . History of claustrophobia   . Hyperlipidemia   . IBS (irritable bowel syndrome)   . PONV (postoperative nausea and vomiting)    with tubal ligation  . 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 Ulcerative colitis [facility])         Past Surgical History:  Procedure Laterality Date  . BRONCHOSCOPY  1995  . COLONOSCOPY  2009   Dr. West Carbo: Procedure being done for surveillance for chronic ulcerative  colitis.  No endoscopic appearance of active colitis.  Random colon biopsies taken to look for dysplasia.  Biopsy showed nonspecific mild chronic inflammation of the colon  . TONSILLECTOMY    . TUBAL LIGATION      Current Facility-Administered Medications  Medication Dose Route Frequency Provider Last Rate Last Admin  . bupivacaine liposome (EXPAREL) 1.3 % injection 266 mg  20 mL Infiltration Once Benjiman Core M, PA-C      . ceFAZolin (ANCEF) IVPB 2g/100 mL premix  2 g Intravenous On Call to OR Lanae Crumbly, PA-C      . fentaNYL (SUBLIMAZE) 100 MCG/2ML injection           . lactated ringers infusion   Intravenous Continuous Albertha Ghee, MD 10 mL/hr at 09/07/20 1135 Continued from Pre-op at 09/07/20 1135  . midazolam (VERSED) 2 MG/2ML injection            Allergies  Allergen Reactions  . Codeine     Headache   . Latex     Irritates skin     Social History   Tobacco Use  . Smoking status: Never Smoker  . Smokeless tobacco: Never Used  Substance Use Topics  . Alcohol use: Not Currently    Family History  Problem Relation Age of Onset  . Colitis Sister        require total colectomy. UC  . Lung cancer Sister   . Colon  cancer Neg Hx      Review of Systems  Constitutional: Positive for activity change.  HENT: Negative.   Respiratory: Negative.   Cardiovascular: Negative.   Gastrointestinal: Negative.   Genitourinary: Negative.   Neurological: Negative.   Psychiatric/Behavioral: Negative.     Objective:  Physical Exam Constitutional:      Appearance: Normal appearance.  HENT:     Nose: Nose normal.  Eyes:     Extraocular Movements: Extraocular movements intact.     Pupils: Pupils are equal, round, and reactive to light.  Cardiovascular:     Rate and Rhythm: Regular rhythm.  Pulmonary:     Effort: No respiratory distress.     Breath sounds: Normal breath sounds.  Abdominal:     General: Bowel sounds are normal. There is no distension.  Musculoskeletal:         General: Tenderness present.  Neurological:     General: No focal deficit present.     Mental Status: She is alert and oriented to person, place, and time.  Psychiatric:        Mood and Affect: Mood normal.     Vital signs in last 24 hours: Pulse Rate:  [89] (P) 89 (02/16 1036) Resp:  [18] (P) 18 (02/16 1036) BP: (P) 148/64 (02/16 1036) SpO2:  [98 %] (P) 98 % (02/16 1036) Weight:  [64 kg] 64 kg (02/16 1110)  Labs:   Estimated body mass index is 23.46 kg/m as calculated from the following:   Height as of this encounter: 5' 5"  (1.651 m).   Weight as of this encounter: 64 kg.   Imaging Review Plain radiographs demonstrate moderate degenerative joint disease of the left knee(s). The overall alignment ismild varus. The bone quality appears to be good for age and reported activity level.      Assessment/Plan:  End stage arthritis, left knee   The patient history, physical examination, clinical judgment of the provider and imaging studies are consistent with end stage degenerative joint disease of the left knee(s) and total knee arthroplasty is deemed medically necessary. The treatment options including medical management, injection therapy arthroscopy and arthroplasty were discussed at length. The risks and benefits of total knee arthroplasty were presented and reviewed. The risks due to aseptic loosening, infection, stiffness, patella tracking problems, thromboembolic complications and other imponderables were discussed. The patient acknowledged the explanation, agreed to proceed with the plan and consent was signed. Patient is being admitted for inpatient treatment for surgery, pain control, PT, OT, prophylactic antibiotics, VTE prophylaxis, progressive ambulation and ADL's and discharge planning. The patient is planning to be discharged home with home health services     Patient's anticipated LOS is less than 2 midnights, meeting these requirements: - Younger than 98 -  Lives within 1 hour of care - Has a competent adult at home to recover with post-op recover - NO history of  - Chronic pain requiring opiods  - Diabetes  - Coronary Artery Disease  - Heart failure  - Heart attack  - Stroke  - DVT/VTE  - Cardiac arrhythmia  - Respiratory Failure/COPD  - Renal failure  - Anemia  - Advanced Liver disease

## 2020-09-07 NOTE — Progress Notes (Signed)
Orthopedic Tech Progress Note Patient Details:  Carol Roach [birth year] [id number] Came to do CPM patient was working with PT. Patient wanted to eat dinner before getting on CPM. Notified RN to call ortho after patient eats dinner to place patient on CPM. Patient ID: Carol Roach, female   DOB: 07-15-1946, 75 y.o.   MRN: [id number]   [name] 09/07/2020, 5:40 PM

## 2020-09-07 NOTE — Op Note (Signed)
Preop diagnosis: Left knee primary osteoarthritis  Postop diagnosis: Same  Procedure: Left total knee arthroplasty.  Surgeon: Lorin Mercy MD  Assistant: Benjiman Core, PA-C medically necessary and present for the entire procedure  Anesthesia preoperative block plus spinal anesthesia plus Exparel 20 cc Marcaine 20cc.  EBL: Less than 100 cc  Tourniquet: 300 x 47 minutes  ImplantsCEMENT HV SMART SET - [id number]  Inventory Item: CEMENT HV SMART SET Serial no.:  Model/Cat no.: [id number]  Implant name: CEMENT HV SMART SET - [id number] Laterality: Left Area: Knee  Manufacturer: [employer] Date of Manufacture:    Action: Implanted Number Used: 1   Device Identifier:  Device Identifier Type:     CEMENT HV SMART SET - [id number]  Inventory Item: CEMENT HV SMART SET Serial no.:  Model/Cat no.: [id number]  Implant name: CEMENT HV SMART SET - [id number] Laterality: Left Area: Knee  Manufacturer: [employer] Date of Manufacture:    Action: Implanted Number Used: 1   Device Identifier:  Device Identifier Type:     TIBIAL BASE ROT PLAT SZ 5 KNEE - [id number]  Inventory Item: TIBIAL BASE ROT PLAT SZ 5 KNEE Serial no.:  Model/Cat no.: [id number]  Implant name: TIBIAL BASE ROT PLAT SZ 5 KNEE - [id number] Laterality: Left Area: Knee  Manufacturer: [employer] Date of Manufacture:    Action: Implanted Number Used: 1   Device Identifier:  Device Identifier Type:     ATTUNE PSRP INSR SZ6 5MM KNEE - [id number]  Inventory Item: ATTUNE PSRP INSR SZ6 5MM KNEE Serial no.:  Model/Cat no.: [id number]  Implant name: ATTUNE PSRP INSR SZ6 5MM KNEE - [id number] Laterality: Left Area: Knee  Manufacturer: [employer] Date of Manufacture:    Action: Implanted Number Used: 1   Device Identifier:  Device Identifier Type:     ATTUNE PS FEM LT SZ 6 CEM KNEE - [id number]  Inventory Item: ATTUNE PS FEM LT SZ 6 CEM KNEE Serial no.:  Model/Cat no.: [id number]  Implant name: ATTUNE PS FEM LT SZ 6 CEM KNEE  - [id number] Laterality: Left Area: Knee  Manufacturer: [employer] Date of Manufacture:    Action: Implanted Number Used: 1   Device Identifier:  Device Identifier Type:     PATELLA MEDIAL ATTUN 35MM KNEE - [id number]  Inventory Item: PATELLA MEDIAL ATTUN 35MM KNEE Serial no.:  Model/Cat no.: [id number]  Implant name: PATELLA MEDIAL ATTUN 35MM KNEE - [id number] Laterality: Left Area: Knee  Manufacturer: [employer] Date of Manufacture:    Action: Implanted Number Used: 1   Device Identifier:  Device Identifier Type:     Procedure: After preoperative block proximal thigh tourniquet heel bump lateral post DuraPrep tip of toes usual total knee sheets drapes were applied timeout procedure IV TXA.  DuraPrep usual total knee sheets drapes sterile skin marker Betadine Steri-Drape sealing the skin and wrapped in Esmarch with tourniquet inflation prior to incision.  Timeout procedure was completed midline incision was made medial parapatellar incision was made patella was everted resected 10 mm.  Eburnated bone patellofemoral joint and medial compartment eburnated bone with grade 3 and 4 changes lateral compartment was noted.  Marginal osteophytes were trimmed meniscal remnants were trimmed hole drilled in the femur initially 10 mm resected off the distal femur we had come back and resect an additional 2 mm for a total of 12.  Resection of the tibia required 2 mm more to get the 5 mm spacer block securely placed.  ACL PCL been resected.  Chamfer cuts box cuts  keel preparation was made pulsatile lavage drilling the lug nuts in the femur as well as patella for a 35 mm patella.  Pulsatile lavage while cement was mixed in.  Tibia was cemented followed by femur placement of permanent poly and patella held with a patellar clamp all excessive cement was removed.  Cement was hardened 15 minutes tourniquet deflated hemostasis obtained.  Left ankle still out and cement was setting up Exparel plus Marcaine was  injected with 18-gauge needle total of 40 cc.  Standard layered closure #1 Vicryl interrupted 2-0 Vicryl subtenons tissue skin was closed with skin staple closure postop dressing knee immobilizer and transfer the care room.  Patient tolerated the procedure well.

## 2020-09-07 NOTE — Discharge Instructions (Signed)
INSTRUCTIONS AFTER JOINT REPLACEMENT   o Remove items at home which could result in a fall. This includes throw rugs or furniture in walking pathways o ICE to the affected joint every three hours while awake for 30 minutes at a time, for at least the first 3-5 days, and then as needed for pain and swelling.  Continue to use ice for pain and swelling. You may notice swelling that will progress down to the foot and ankle.  This is normal after surgery.  Elevate your leg when you are not up walking on it.   o Continue to use the breathing machine you got in the hospital (incentive spirometer) which will help keep your temperature down.  It is common for your temperature to cycle up and down following surgery, especially at night when you are not up moving around and exerting yourself.  The breathing machine keeps your lungs expanded and your temperature down.   DIET:  As you were doing prior to hospitalization, we recommend a well-balanced diet.  DRESSING / WOUND CARE / SHOWERING  Ok to change dressing and shower 3 days postop.  No tub soaking.  Do not apply any creams or ointments to incision.  Dressing changes with gauze and tape.    ACTIVITY  o Increase activity slowly as tolerated, but follow the weight bearing instructions below.   o No driving for 6 weeks or until further direction given by your physician.  You cannot drive while taking narcotics.  o No lifting or carrying greater than 10 lbs. until further directed by your surgeon. o Avoid periods of inactivity such as sitting longer than an hour when not asleep. This helps prevent blood clots.  o You may return to work once you are authorized by your doctor.     WEIGHT BEARING   Weight bearing as tolerated with assist device (walker, cane, etc) as directed, use it as long as suggested by your surgeon or therapist, typically at least 4-6 weeks.   EXERCISES  Results after joint replacement surgery are often greatly improved when you  follow the exercise, range of motion and muscle strengthening exercises prescribed by your doctor. Safety measures are also important to protect the joint from further injury. Any time any of these exercises cause you to have increased pain or swelling, decrease what you are doing until you are comfortable again and then slowly increase them. If you have problems or questions, call your caregiver or physical therapist for advice.   Rehabilitation is important following a joint replacement. After just a few days of immobilization, the muscles of the leg can become weakened and shrink (atrophy).  These exercises are designed to build up the tone and strength of the thigh and leg muscles and to improve motion. Often times heat used for twenty to thirty minutes before working out will loosen up your tissues and help with improving the range of motion but do not use heat for the first two weeks following surgery (sometimes heat can increase post-operative swelling).   These exercises can be done on a training (exercise) mat, on the floor, on a table or on a bed. Use whatever works the best and is most comfortable for you.    Use music or television while you are exercising so that the exercises are a pleasant break in your day. This will make your life better with the exercises acting as a break in your routine that you can look forward to.   Perform all exercises  about fifteen times, three times per day or as directed.  You should exercise both the operative leg and the other leg as well.  Exercises include:   . Quad Sets - Tighten up the muscle on the front of the thigh (Quad) and hold for 5-10 seconds.   . Straight Leg Raises - With your knee straight (if you were given a brace, keep it on), lift the leg to 60 degrees, hold for 3 seconds, and slowly lower the leg.  Perform this exercise against resistance later as your leg gets stronger.  . Leg Slides: Lying on your back, slowly slide your foot toward your  buttocks, bending your knee up off the floor (only go as far as is comfortable). Then slowly slide your foot back down until your leg is flat on the floor again.  Glenard Haring Wings: Lying on your back spread your legs to the side as far apart as you can without causing discomfort.  . Hamstring Strength:  Lying on your back, push your heel against the floor with your leg straight by tightening up the muscles of your buttocks.  Repeat, but this time bend your knee to a comfortable angle, and push your heel against the floor.  You may put a pillow under the heel to make it more comfortable if necessary.   A rehabilitation program following joint replacement surgery can speed recovery and prevent re-injury in the future due to weakened muscles. Contact your doctor or a physical therapist for more information on knee rehabilitation.    CONSTIPATION  Constipation is defined medically as fewer than three stools per week and severe constipation as less than one stool per week.  Even if you have a regular bowel pattern at home, your normal regimen is likely to be disrupted due to multiple reasons following surgery.  Combination of anesthesia, postoperative narcotics, change in appetite and fluid intake all can affect your bowels.   YOU MUST use at least one of the following options; they are listed in order of increasing strength to get the job done.  They are all available over the counter, and you may need to use some, POSSIBLY even all of these options:    Drink plenty of fluids (prune juice may be helpful) and high fiber foods Colace 100 mg by mouth twice a day  Senokot for constipation as directed and as needed Dulcolax (bisacodyl), take with full glass of water  Miralax (polyethylene glycol) once or twice a day as needed.  If you have tried all these things and are unable to have a bowel movement in the first 3-4 days after surgery call either your surgeon or your primary doctor.    If you experience  loose stools or diarrhea, hold the medications until you stool forms back up.  If your symptoms do not get better within 1 week or if they get worse, check with your doctor.  If you experience "the worst abdominal pain ever" or develop nausea or vomiting, please contact the office immediately for further recommendations for treatment.   ITCHING:  If you experience itching with your medications, try taking only a single pain pill, or even half a pain pill at a time.  You can also use Benadryl over the counter for itching or also to help with sleep.   TED HOSE STOCKINGS:  Use stockings on both legs until for at least 2 weeks or as directed by physician office. They may be removed at night for sleeping.  MEDICATIONS:  See your medication summary on the "After Visit Summary" that nursing will review with you.  You may have some home medications which will be placed on hold until you complete the course of blood thinner medication.  It is important for you to complete the blood thinner medication as prescribed.  PRECAUTIONS:  If you experience chest pain or shortness of breath - call 911 immediately for transfer to the hospital emergency department.   If you develop a fever greater that 101 F, purulent drainage from wound, increased redness or drainage from wound, foul odor from the wound/dressing, or calf pain - CONTACT YOUR SURGEON.                                                   FOLLOW-UP APPOINTMENTS:  If you do not already have a post-op appointment, please call the office for an appointment to be seen by your surgeon.  Guidelines for how soon to be seen are listed in your "After Visit Summary", but are typically between 1-4 weeks after surgery.  OTHER INSTRUCTIONS:   Knee Replacement:  Do not place pillow under knee, focus on keeping the knee straight while resting. CPM instructions: 0-90 degrees, 2 hours in the morning, 2 hours in the afternoon, and 2 hours in the evening. Place foam block,  curve side up under heel at all times except when in CPM or when walking.  DO NOT modify, tear, cut, or change the foam block in any way.   DENTAL ANTIBIOTICS:  In most cases prophylactic antibiotics for Dental procdeures after total joint surgery are not necessary.  Exceptions are as follows:  1. History of prior total joint infection  2. Severely immunocompromised (Organ Transplant, cancer chemotherapy, Rheumatoid biologic meds such as Eleele)  3. Poorly controlled diabetes (A1C &gt; 8.0, blood glucose over 200)  If you have one of these conditions, contact your surgeon for an antibiotic prescription, prior to your dental procedure.   MAKE SURE YOU:  . Understand these instructions.  . Get help right away if you are not doing well or get worse.    Thank you for letting us be a part of your medical care team.  It is a privilege we respect greatly.  We hope these instructions will help you stay on track for a fast and full recovery!

## 2020-09-07 NOTE — Anesthesia Procedure Notes (Signed)
Anesthesia Regional Block: Adductor canal block   Pre-Anesthetic Checklist: ,, timeout performed, Correct Patient, Correct Site, Correct Laterality, Correct Procedure, Correct Position, site marked, Risks and benefits discussed, pre-op evaluation,  At surgeon's request and post-op pain management  Laterality: Left  Prep: Maximum Sterile Barrier Precautions used, chloraprep       Needles:  Injection technique: Single-shot  Needle Type: Echogenic Stimulator Needle     Needle Length: 9cm  Needle Gauge: 22     Additional Needles:   Procedures:,,,, ultrasound used (permanent image in chart),,,,  Narrative:  Start time: 09/07/2020 12:27 PM End time: 09/07/2020 12:30 PM Injection made incrementally with aspirations every 5 mL.  Performed by: Personally  Anesthesiologist: Brennan Bailey, MD  Additional Notes: Risks, benefits, and alternative discussed. Patient gave consent for procedure. Patient prepped and draped in sterile fashion. Sedation administered, patient remains easily responsive to voice. Relevant anatomy identified with ultrasound guidance. Local anesthetic given in 5cc increments with no signs or symptoms of intravascular injection. No pain or paraesthesias with injection. Patient monitored throughout procedure with signs of LAST or immediate complications. Tolerated well. Ultrasound image placed in chart.  Tawny Asal, MD

## 2020-09-07 NOTE — Evaluation (Signed)
Physical Therapy Evaluation Patient Details Name: Carol Roach MRN: [id number] DOB: 30-Jun-1946 Today's Date: 09/07/2020   History of Present Illness  Pt is a 75 y/o female s/p L TKA. PMH includes asthma and HLD.  Clinical Impression  Pt is s/p surgery above with deficits below. Ambulation limited secondary to pain. Requiring min to min guard A for mobility using RW. Educated about knee precautions. Reports husband will be able to assist at d/c. Will continue to follow acutely.    Follow Up Recommendations Follow surgeon's recommendation for DC plan and follow-up therapies    Equipment Recommendations  None recommended by PT    Recommendations for Other Services       Precautions / Restrictions Precautions Precautions: Knee Precaution Booklet Issued: No Precaution Comments: Verbally reviewed knee precautions. Required Braces or Orthoses: Knee Immobilizer - Left Restrictions Weight Bearing Restrictions: Yes LLE Weight Bearing: Weight bearing as tolerated      Mobility  Bed Mobility Overal bed mobility: Needs Assistance Bed Mobility: Supine to Sit     Supine to sit: Min assist     General bed mobility comments: Min A for LLE management.    Transfers Overall transfer level: Needs assistance Equipment used: Rolling walker (2 wheeled) Transfers: Sit to/from Stand Sit to Stand: Min assist         General transfer comment: Min A for steadying. Demonstrated safe hand placement.  Ambulation/Gait Ambulation/Gait assistance: Min guard Gait Distance (Feet): 15 Feet Assistive device: Rolling walker (2 wheeled) Gait Pattern/deviations: Step-to pattern;Decreased step length - right;Decreased step length - left;Decreased weight shift to left Gait velocity: Decreased   General Gait Details: Mild instability noted in LLE. Min guard for safety. Cues for sequencing using RW.  Stairs            Wheelchair Mobility    Modified Rankin (Stroke Patients Only)        Balance Overall balance assessment: Needs assistance Sitting-balance support: No upper extremity supported;Feet supported Sitting balance-[name]: Good     Standing balance support: Bilateral upper extremity supported;During functional activity Standing balance-[name]: Poor Standing balance comment: Reliant on BUE support                             Pertinent Vitals/Pain Pain Assessment: 0-10 Pain Score: 4  Pain Location: L knee Pain Descriptors / Indicators: Aching;Operative site guarding Pain Intervention(s): Limited activity within patient's tolerance;Monitored during session;Repositioned    Home Living Family/patient expects to be discharged to:: Private residence Living Arrangements: Spouse/significant other Available Help at Discharge: Family Type of Home: House Home Access: Stairs to enter Entrance Stairs-Rails: Psychiatric nurse of Steps: 2 Home Layout: One level Home Equipment: Environmental consultant - 2 wheels;Bedside commode      Prior Function Level of Independence: Independent               Hand Dominance        Extremity/Trunk Assessment   Upper Extremity Assessment Upper Extremity Assessment: Overall WFL for tasks assessed    Lower Extremity Assessment Lower Extremity Assessment: LLE deficits/detail LLE Deficits / Details: Deficits consistent with post op pain and weakness.    Cervical / Trunk Assessment Cervical / Trunk Assessment: Normal  Communication   Communication: No difficulties  Cognition Arousal/Alertness: Awake/alert Behavior During Therapy: WFL for tasks assessed/performed Overall Cognitive Status: Within Functional Limits for tasks assessed  General Comments      Exercises     Assessment/Plan    PT Assessment Patient needs continued PT services  PT Problem List Decreased strength;Decreased balance;Decreased mobility;Decreased activity  tolerance;Decreased knowledge of use of DME;Pain       PT Treatment Interventions DME instruction;Gait training;Functional mobility training;Stair training;Therapeutic activities;Therapeutic exercise;Balance training;Patient/family education    PT Goals (Current goals can be found in the Care Plan section)  Acute Rehab PT Goals Patient Stated Goal: to go home PT Goal Formulation: With patient Time For Goal Achievement: 09/21/20 Potential to Achieve Goals: Fair    Frequency 7X/week   Barriers to discharge        Co-evaluation               AM-PAC PT "6 Clicks" Mobility  Outcome Measure Help needed turning from your back to your side while in a flat bed without using bedrails?: A Little Help needed moving from lying on your back to sitting on the side of a flat bed without using bedrails?: A Little Help needed moving to and from a bed to a chair (including a wheelchair)?: A Little Help needed standing up from a chair using your arms (e.g., wheelchair or bedside chair)?: A Little Help needed to walk in hospital room?: A Little Help needed climbing 3-5 steps with a railing? : A Little 6 Click Score: 18    End of Session Equipment Utilized During Treatment: Gait belt;Left knee immobilizer Activity Tolerance: Patient limited by pain Patient left: in chair;with call bell/phone within reach Nurse Communication: Mobility status PT Visit Diagnosis: Unsteadiness on feet (R26.81);Muscle weakness (generalized) (M62.81)    Time: 9798-9211 PT Time Calculation (min) (ACUTE ONLY): 17 min   Charges:   PT Evaluation $PT Eval Low Complexity: 1 Low          Carol Roach, DPT  Acute Rehabilitation Services  Pager: [phone] Office: [phone]   Carol Roach 09/07/2020, 5:08 PM

## 2020-09-07 NOTE — Transfer of Care (Signed)
Immediate Anesthesia Transfer of Care Note  Patient: Caydee L Buehrer  Procedure(s) Performed: LEFT TOTAL KNEE ARTHROPLASTY (Left Knee)  Patient Location: PACU  Anesthesia Type:Spinal  Level of Consciousness: drowsy and patient cooperative  Airway & Oxygen Therapy: Patient Spontanous Breathing and Patient connected to face mask oxygen  Post-op Assessment: Report given to RN and Post -op Vital signs reviewed and stable  Post vital signs: Reviewed  Last Vitals:  Vitals Value Taken Time  BP 110/54 09/07/20 1436  Temp    Pulse 71 09/07/20 1438  Resp 14 09/07/20 1438  SpO2 100 % 09/07/20 1438  Vitals shown include unvalidated device data.  Last Pain:  Vitals:   09/07/20 1036  TempSrc: (P) Oral         Complications: No complications documented.

## 2020-09-07 NOTE — Anesthesia Postprocedure Evaluation (Signed)
Anesthesia Post Note  Patient: Carol Roach  Procedure(s) Performed: LEFT TOTAL KNEE ARTHROPLASTY (Left Knee)     Patient location during evaluation: PACU Anesthesia Type: Spinal Level of consciousness: awake and alert and oriented Pain management: pain level controlled Vital Signs Assessment: post-procedure vital signs reviewed and stable Respiratory status: spontaneous breathing, nonlabored ventilation and respiratory function stable Cardiovascular status: blood pressure returned to baseline Postop Assessment: no apparent nausea or vomiting, spinal receding, no headache and no backache Anesthetic complications: no   No complications documented.  Last Vitals:  Vitals:   09/07/20 1550 09/07/20 1625  BP: (!) 109/44 115/83  Pulse: 72 69  Resp: 12 18  Temp: 36.5 C 36.4 C  SpO2: 96% 100%    Last Pain:  Vitals:   09/07/20 1625  TempSrc: Oral  PainSc:                  Brennan Bailey

## 2020-09-07 NOTE — Progress Notes (Signed)
Orthopedic Tech Progress Note Patient Details:  Carol Roach Dec 14, 1945 [id number]  CPM Left Knee CPM Left Knee: On Left Knee Flexion (Degrees): 70 Left Knee Extension (Degrees): 0  Post Interventions Patient Tolerated: Molly Maduro Ferris Tally 09/07/2020, 7:32 PM

## 2020-09-07 NOTE — Interval H&P Note (Signed)
History and Physical Interval Note:  09/07/2020 12:23 PM  Carol Roach  has presented today for surgery, with the diagnosis of left knee osteoarthritis.  The various methods of treatment have been discussed with the patient and family. After consideration of risks, benefits and other options for treatment, the patient has consented to  Procedure(s): LEFT TOTAL KNEE ARTHROPLASTY (Left) as a surgical intervention.  The patient's history has been reviewed, patient examined, no change in status, stable for surgery.  I have reviewed the patient's chart and labs.  Questions were answered to the patient's satisfaction.     [name]

## 2020-09-07 NOTE — Anesthesia Procedure Notes (Signed)
Procedure Name: MAC Date/Time: 09/07/2020 12:43 PM Performed by: Janene Harvey, CRNA Pre-anesthesia Checklist: Patient identified, Emergency Drugs available, Patient being monitored, Timeout performed and Suction available Patient Re-evaluated:Patient Re-evaluated prior to induction Oxygen Delivery Method: Simple face mask Preoxygenation: Pre-oxygenation with 100% oxygen Induction Type: IV induction Placement Confirmation: positive ETCO2 Dental Injury: Teeth and Oropharynx as per pre-operative assessment

## 2020-09-08 ENCOUNTER — Encounter (HOSPITAL_COMMUNITY): Payer: Self-pay | Admitting: Orthopaedic Surgery

## 2020-09-08 ENCOUNTER — Telehealth: Payer: Self-pay | Admitting: Radiology

## 2020-09-08 DIAGNOSIS — Z85828 Personal history of other malignant neoplasm of skin: Secondary | ICD-10-CM | POA: Diagnosis not present

## 2020-09-08 DIAGNOSIS — Z91040 Latex allergy status: Secondary | ICD-10-CM | POA: Diagnosis not present

## 2020-09-08 DIAGNOSIS — Z20822 Contact with and (suspected) exposure to covid-19: Secondary | ICD-10-CM | POA: Diagnosis present

## 2020-09-08 DIAGNOSIS — Z885 Allergy status to narcotic agent status: Secondary | ICD-10-CM | POA: Diagnosis not present

## 2020-09-08 DIAGNOSIS — M1712 Unilateral primary osteoarthritis, left knee: Secondary | ICD-10-CM | POA: Diagnosis present

## 2020-09-08 DIAGNOSIS — Z96659 Presence of unspecified artificial knee joint: Secondary | ICD-10-CM

## 2020-09-08 DIAGNOSIS — E785 Hyperlipidemia, unspecified: Secondary | ICD-10-CM | POA: Diagnosis present

## 2020-09-08 DIAGNOSIS — J45909 Unspecified asthma, uncomplicated: Secondary | ICD-10-CM | POA: Diagnosis present

## 2020-09-08 DIAGNOSIS — Z79899 Other long term (current) drug therapy: Secondary | ICD-10-CM | POA: Diagnosis not present

## 2020-09-08 LAB — BASIC METABOLIC PANEL
Anion gap: 9 (ref 5–15)
BUN: 12 mg/dL (ref 8–23)
CO2: 24 mmol/L (ref 22–32)
Calcium: 8.8 mg/dL — ABNORMAL LOW (ref 8.9–10.3)
Chloride: 100 mmol/L (ref 98–111)
Creatinine, Ser: 0.7 mg/dL (ref 0.44–1.00)
GFR, Estimated: >60 mL/min (ref >60)
Glucose, Bld: 140 mg/dL — ABNORMAL HIGH (ref 70–99)
Potassium: 4.1 mmol/L (ref 3.5–5.1)
Sodium: 133 mmol/L — ABNORMAL LOW (ref 135–145)

## 2020-09-08 LAB — CBC
HCT: 38.2 % (ref 36.0–46.0)
Hemoglobin: 12.6 g/dL (ref 12.0–15.0)
MCH: 31 pg (ref 26.0–34.0)
MCHC: 33 g/dL (ref 30.0–36.0)
MCV: 93.9 fL (ref 80.0–100.0)
Platelets: 201 10*3/uL (ref 150–400)
RBC: 4.07 MIL/uL (ref 3.87–5.11)
RDW: 11.9 % (ref 11.5–15.5)
WBC: 10.2 10*3/uL (ref 4.0–10.5)
nRBC: 0 % (ref 0.0–0.2)

## 2020-09-08 MED ORDER — OXYCODONE-ACETAMINOPHEN 5-325 MG PO TABS
1.0000 | ORAL_TABLET | ORAL | Status: DC | PRN
  Administered 2020-09-08: 2 via ORAL
  Administered 2020-09-08 – 2020-09-09 (×5): 1 via ORAL
  Filled 2020-09-08 (×4): qty 1
  Filled 2020-09-08: qty 2
  Filled 2020-09-08: qty 1

## 2020-09-08 MED ORDER — HYDROXYZINE HCL 50 MG/ML IM SOLN
50.0000 mg | Freq: Once | INTRAMUSCULAR | Status: AC
  Administered 2020-09-08: 50 mg via INTRAMUSCULAR
  Filled 2020-09-08: qty 1

## 2020-09-08 MED ORDER — TRAMADOL HCL 50 MG PO TABS: 50.0000 mg | ORAL_TABLET | Freq: Four times a day (QID) | ORAL | 0 refills | Status: DC | PRN

## 2020-09-08 MED ORDER — TRAMADOL HCL 50 MG PO TABS
50.0000 mg | ORAL_TABLET | Freq: Four times a day (QID) | ORAL | Status: DC | PRN
  Administered 2020-09-08: 50 mg via ORAL
  Filled 2020-09-08: qty 1

## 2020-09-08 MED ORDER — KETOROLAC TROMETHAMINE 30 MG/ML IJ SOLN
30.0000 mg | Freq: Once | INTRAMUSCULAR | Status: AC
  Administered 2020-09-08: 30 mg via INTRAMUSCULAR
  Filled 2020-09-08: qty 1

## 2020-09-08 NOTE — Anesthesia Postprocedure Evaluation (Signed)
Anesthesia Post Note  S: Patient was seen on floor by request of surgical service for complaint of headache following left knee arthroplasty on 09/07/20. She had a spinal anesthetic for her procedure with no difficulty encountered. Her description of pain is somewhat vague but she believes it started sometime shortly after her surgery yesterday. She describes her pain as dull, located in the front of her head bilaterally, 4/10 in severity, and somewhat improved this morning after she took pain medication for her knee pain. She has been up walking with PT without any significant change in her headache. She denies photophobia, tinnitus, or vision changes. She has neck stiffness/pain that has been present prior to hospitalization and is at baseline intensity. She also notes that she usually drinks caffeinated beverages and believes her lack of any caffeine since hospitalization may be a contributing factor.  O: BP 126/85 (BP Location: Left Arm)   Pulse 73   Temp 36.5 C (Oral)   Resp 18   Ht 5' 5"  (1.651 m)   Wt 64 kg   SpO2 100%   BMI 23.46 kg/m    Patient sitting upright in bed, eyes open, lights on in room. Appears in no distress. No gross motor deficits.  A/P: #Headache Patient's headache has no features consistent with post-dural puncture etiology. I have encouraged her to continue conservative management including hydration, caffeine, and rest. If her headache persists, she may benefit from [place]. I have communicated my evaluation with Dr. Lorin Mercy, her primary physician.    Mathews Robinsons, MD, MPH Anesthesiology

## 2020-09-08 NOTE — Evaluation (Signed)
Occupational Therapy Evaluation Patient Details Name: Carol Roach MRN: [id number] DOB: 06-07-1946 Today's Date: 09/08/2020    History of Present Illness Pt is a 75 y/o female s/p L TKA. PMH includes asthma and HLD.   Clinical Impression   PTA, pt was living with her husband and was independent. Currently, pt requires Min Guard A for LB ADLs and functional mobility using RW. Provided education on LB ADLs and toilet transfer; pt demonstrating understanding. Pt will require further acute OT to provide education on tub transfer with 3N1. Recommend dc to home once medically stable per physician.    Follow Up Recommendations  No OT follow up;Supervision/Assistance - 24 hour    Equipment Recommendations  None recommended by OT    Recommendations for Other Services PT consult     Precautions / Restrictions Precautions Precautions: Knee Precaution Booklet Issued: No Precaution Comments: Verbally reviewed knee precautions. Required Braces or Orthoses: Knee Immobilizer - Left Restrictions Weight Bearing Restrictions: Yes LLE Weight Bearing: Weight bearing as tolerated      Mobility Bed Mobility               General bed mobility comments: Sitting in recliner upon arrival    Transfers Overall transfer level: Needs assistance Equipment used: Rolling walker (2 wheeled) Transfers: Sit to/from Stand Sit to Stand: Min guard         General transfer comment: Min Guard A for safety    Balance Overall balance assessment: Needs assistance Sitting-balance support: No upper extremity supported;Feet supported       Standing balance support: Bilateral upper extremity supported;No upper extremity supported;During functional activity Standing balance-[name]: Fair Standing balance comment: Able to maintain static standing while performing LB dressing.                           ADL either performed or assessed with clinical judgement   ADL Overall ADL's :  Needs assistance/impaired Eating/Feeding: Set up;Sitting   Grooming: Set up;Sitting   Upper Body Bathing: Supervision/ safety;Set up;Sitting   Lower Body Bathing: Min guard;Sit to/from stand   Upper Body Dressing : Supervision/safety;Set up;Sitting   Lower Body Dressing: Min guard;Sit to/from stand Lower Body Dressing Details (indicate cue type and reason): Min Guard A for safety in standing. Educating pt on donning RLE first. Pt bending forward to don socks with increaed effort. Toilet Transfer: Min guard;Ambulation;RW (simulated at recliner) Armed forces technical officer Details (indicate cue type and reason): Min Guard A for safety. Pt requiring cues for hand placement.       Tub/Shower Transfer Details (indicate cue type and reason): Will need education on shower transfer Functional mobility during ADLs: Min guard;Rolling walker General ADL Comments: Pt performing ADLs and functional mobility Supervision-Min Guard A.     Vision Baseline Vision/History: No visual deficits       Perception     Praxis      Pertinent Vitals/Pain Pain Assessment: Faces Faces Pain Scale: Hurts little more Pain Location: L knee Pain Descriptors / Indicators: Aching;Operative site guarding Pain Intervention(s): Monitored during session;Limited activity within patient's tolerance;Repositioned     Hand Dominance     Extremity/Trunk Assessment Upper Extremity Assessment Upper Extremity Assessment: Overall WFL for tasks assessed   Lower Extremity Assessment Lower Extremity Assessment: Defer to PT evaluation   Cervical / Trunk Assessment Cervical / Trunk Assessment: Normal   Communication Communication Communication: No difficulties   Cognition Arousal/Alertness: Awake/alert Behavior During Therapy: WFL for tasks assessed/performed Overall  Cognitive Status: Within Functional Limits for tasks assessed                                     General Comments       Exercises      Shoulder Instructions      Home Living Family/patient expects to be discharged to:: Private residence Living Arrangements: Spouse/significant other Available Help at Discharge: Family Type of Home: House Home Access: Stairs to enter [employer] of Steps: 2 Entrance Stairs-Rails: Right;Left Home Layout: One level     Bathroom Shower/Tub: Walk-in shower;Tub only   Bathroom Toilet: Handicapped height (BSC over toilet) Bathroom Accessibility: Yes   Home Equipment: Walker - 2 wheels;Bedside commode;Shower seat          Prior Functioning/Environment Level of Independence: Independent                 OT Problem List: Decreased strength;Decreased activity tolerance;Impaired balance (sitting and/or standing);Decreased knowledge of use of DME or AE;Decreased knowledge of precautions;Pain      OT Treatment/Interventions: Self-care/ADL training;Therapeutic exercise;Energy conservation;DME and/or AE instruction;Therapeutic activities;Balance training;Patient/family education    OT Goals(Current goals can be found in the care plan section) Acute Rehab OT Goals Patient Stated Goal: to go home OT Goal Formulation: With patient Time For Goal Achievement: 09/22/20 Potential to Achieve Goals: Good  OT Frequency: Min 2X/week   Barriers to D/C:            Co-evaluation              AM-PAC OT "6 Clicks" Daily Activity     Outcome Measure Help from another person eating meals?: None Help from another person taking care of personal grooming?: A Little Help from another person toileting, which includes using toliet, bedpan, or urinal?: A Little Help from another person bathing (including washing, rinsing, drying)?: A Little Help from another person to put on and taking off regular upper body clothing?: None Help from another person to put on and taking off regular lower body clothing?: A Little 6 Click Score: 20   End of Session Equipment Utilized During  Treatment: Rolling walker Nurse Communication: Mobility status  Activity Tolerance: Patient tolerated treatment well Patient left: in chair;with call bell/phone within reach  OT Visit Diagnosis: Other abnormalities of gait and mobility (R26.89);Unsteadiness on feet (R26.81);Muscle weakness (generalized) (M62.81);Pain Pain - Right/Left: Left Pain - part of body: Knee                Time: 0921-0939 OT Time Calculation (min): 18 min Charges:  OT General Charges $OT Visit: 1 Visit OT Evaluation $OT Eval Low Complexity: 1 Low  Marlys Stegmaier MSOT, OTR/L Acute Rehab Pager: [phone] Office: [place] 09/08/2020, 10:06 AM

## 2020-09-08 NOTE — Progress Notes (Signed)
Subjective: Patient seen this morning.  She is complaining of nausea and headache.  Has not had any vomiting.  Not complaining of any left knee pain.  States that she has had some left buttock pain.  States that she had pain in this area before her surgery.    Objective: Vital signs in last 24 hours: Temp:  [97.6 F (36.4 C)-98 F (36.7 C)] 97.8 F (36.6 C) (02/17 0749) Pulse Rate:  [69-89] 72 (02/17 0749) Resp:  [11-22] 18 (02/17 0749) BP: (101-144)/(44-104) 103/54 (02/17 0749) SpO2:  [96 %-100 %] 100 % (02/17 0749) Weight:  [64 kg] 64 kg (02/16 1110)  Intake/Output from previous day: 02/16 0701 - 02/17 0700 In: 1940 [P.O.:600; I.V.:1140; IV Piggyback:200] Out: 450 [Urine:250; Blood:200] Intake/Output this shift: No intake/output data recorded.  Recent Labs    09/05/20 1057 09/08/20 0410  HGB 15.3* 12.6   Recent Labs    09/05/20 1057 09/08/20 0410  WBC 7.0 10.2  RBC 4.97 4.07  HCT 47.6* 38.2  PLT 272 201   Recent Labs    09/07/20 1023 09/08/20 0410  NA 140 133*  K 3.9 4.1  CL 106 100  CO2 23 24  BUN 12 12  CREATININE 0.74 0.70  GLUCOSE 84 140*  CALCIUM 9.4 8.8*   No results for input(s): LABPT, INR in the last 72 hours.  Exam Patient is alert and oriented.  does appear somewhat lethargic.  Left knee dressing clean dry and intact.  Neurovascular intact.    Assessment/Plan: Since patient did have a epidural I did asked the RN to contact the anesthesia department to see if they will come look at the patient.  I discontinue Dilaudid and oxycodone.  We will try Ultram for pain.  Patient was given Vistaril 50 mg IM injection to see if this will help with her nausea.  We will not plan on discharging patient today.  Benjiman Core 09/08/2020, 9:57 AM

## 2020-09-08 NOTE — Anesthesia Procedure Notes (Signed)
Spinal  Patient location during procedure: OR Start time: 09/07/2020 12:43 PM End time: 09/07/2020 12:45 PM Staffing Performed: anesthesiologist  Anesthesiologist: Brennan Bailey, MD Preanesthetic Checklist Completed: patient identified, IV checked, risks and benefits discussed, surgical consent, monitors and equipment checked, pre-op evaluation and timeout performed Spinal Block Patient position: sitting Prep: DuraPrep and site prepped and draped Patient monitoring: continuous pulse ox, blood pressure and heart rate Approach: midline Location: L3-4 Injection technique: single-shot Needle Needle type: Pencan  Needle gauge: 24 G Needle length: 9 cm Additional Notes Risks, benefits, and alternative discussed. Patient gave consent to procedure. Prepped and draped in sitting position. Patient sedated but responsive to voice. Clear CSF obtained after one needle pass. Positive terminal aspiration. No pain or paraesthesias with injection. Patient tolerated procedure well. Vital signs stable. Tawny Asal, MD

## 2020-09-08 NOTE — Progress Notes (Signed)
Physical Therapy Treatment Patient Details Name: Carol Roach MRN: [id number] DOB: 16-May-1946 Today's Date: 09/08/2020    History of Present Illness Pt is a 75 y/o female s/p L TKA. PMH includes asthma and HLD.    PT Comments    Pt received in bed, with ice pack on knee, willing to participate in PT. Completed supine exercises to address ROM and strength. Minimal contraction with quad sets and needed assistance for heel slides. Min A for L LE management for bed mobility. Min guard for transfers, ambulation ~60 ft with RW, and to ascend/descend 2 stairs with railings on either side. Improved heel-toe pattern but still required cueing for upright posture, heel-toe pattern, and RW navigation. Pt needed to use bathroom during session, so only able to review 1st page of supine exercises in HEP. Will need to review 2nd page of supine, sitting and standing HEP. Pt left in chair with all needs met, call bell within reach, and RN aware of status.   Follow Up Recommendations  Follow surgeon's recommendation for DC plan and follow-up therapies     Equipment Recommendations  None recommended by PT    Recommendations for Other Services       Precautions / Restrictions Precautions Precautions: Knee Precaution Booklet Issued: No Precaution Comments: Verbally reviewed knee precautions. Required Braces or Orthoses: Knee Immobilizer - Left Restrictions Weight Bearing Restrictions: Yes LLE Weight Bearing: Weight bearing as tolerated    Mobility  Bed Mobility Overal bed mobility: Needs Assistance Bed Mobility: Supine to Sit     Supine to sit: Min assist     General bed mobility comments: Min A for L LE    Transfers Overall transfer level: Needs assistance Equipment used: Rolling walker (2 wheeled) Transfers: Sit to/from Stand Sit to Stand: Min guard         General transfer comment: Min guard to get fully upright and management of L LE, cueing to push from  EOB  Ambulation/Gait Ambulation/Gait assistance: Min guard Gait Distance (Feet): 60 Feet Assistive device: Rolling walker (2 wheeled) Gait Pattern/deviations: Step-to pattern;Decreased step length - right;Decreased step length - left;Decreased stance time - left;Decreased weight shift to left;Antalgic;Trunk flexed;Drifts right/left Gait velocity: Decreased   General Gait Details: Cueing for upright posture, heel-toe pattern. Reminder to look up and foward. Pt more conscious of heel-toe pattern   Stairs Stairs: Yes Stairs assistance: Min guard Stair Management: Two rails Number of Stairs: 2 General stair comments: Cueing for sequencing and to get as close to step as possible before ascending   Wheelchair Mobility    Modified Rankin (Stroke Patients Only)       Balance Overall balance assessment: Needs assistance Sitting-balance support: No upper extremity supported;Feet supported Sitting balance-[name]: Good     Standing balance support: Bilateral upper extremity supported;During functional activity Standing balance-[name]: Fair Standing balance comment: reliant on BUE support in standing                            Cognition Arousal/Alertness: Awake/alert Behavior During Therapy: Flat affect Overall Cognitive Status: Within Functional Limits for tasks assessed                                 General Comments: anxious about mobility      Exercises Total Joint Exercises Ankle Circles/Pumps: AROM;Both;10 reps Quad Sets: Strengthening;Left;10 reps Towel Squeeze: Both;Strengthening;10 reps Heel Slides: AAROM;Left;5  reps     General Comments        Pertinent Vitals/Pain Pain Assessment: 0-10 Pain Score: 8  Pain Location: L knee Pain Descriptors / Indicators: Aching;Operative site guarding Pain Intervention(s): Monitored during session;Repositioned    Home Living                      Prior Function             PT Goals (current goals can now be found in the care plan section) Acute Rehab PT Goals Patient Stated Goal: to go home PT Goal Formulation: With patient Time For Goal Achievement: 09/21/20 Potential to Achieve Goals: Fair Progress towards PT goals: Progressing toward goals    Frequency    7X/week      PT Plan Current plan remains appropriate    Co-evaluation              AM-PAC PT "6 Clicks" Mobility   Outcome Measure  Help needed turning from your back to your side while in a flat bed without using bedrails?: A Little Help needed moving from lying on your back to sitting on the side of a flat bed without using bedrails?: A Little Help needed moving to and from a bed to a chair (including a wheelchair)?: A Little Help needed standing up from a chair using your arms (e.g., wheelchair or bedside chair)?: A Little Help needed to walk in hospital room?: A Little Help needed climbing 3-5 steps with a railing? : A Little 6 Click Score: 18    End of Session Equipment Utilized During Treatment: Gait belt;Left knee immobilizer Activity Tolerance: Patient tolerated treatment well Patient left: in chair;with call bell/phone within reach Nurse Communication: Mobility status;Patient requests pain meds PT Visit Diagnosis: Unsteadiness on feet (R26.81);Muscle weakness (generalized) (M62.81)      Rosita Kea, SPT

## 2020-09-08 NOTE — Telephone Encounter (Signed)
Below message received in [place] office today. Dr. Lorin Mercy called and spoke with nurse taking care of patient. Husband called [place] office back and was advised.  Pt's husband is calling stating his wife can get not pain medicine. She is having PT but no one will give her anything for the pain. Is there anything Dr Lorin Mercy can do?    Please call the husband at  [place]

## 2020-09-08 NOTE — Addendum Note (Signed)
Addendum  created 09/08/20 1431 by Brennan Bailey, MD   Clinical Note Signed

## 2020-09-08 NOTE — Addendum Note (Signed)
Addendum  created 09/08/20 0901 by Brennan Bailey, MD   Child order released for a procedure order, Clinical Note Signed, Intraprocedure Blocks edited

## 2020-09-08 NOTE — Care Management Obs Status (Signed)
[facility] NOTIFICATION   Patient Details  Name: Carol Roach MRN: [id number] Date of Birth: 03-Apr-1946   Medicare Observation Status Notification Given:  Yes    Joanne Chars, LCSW 09/08/2020, 1:56 PM

## 2020-09-08 NOTE — Progress Notes (Signed)
Physical Therapy Treatment Patient Details Name: Carol Roach MRN: [id number] DOB: Oct 13, 1945 Today's Date: 09/08/2020    History of Present Illness Pt is a 75 y/o female s/p L TKA. PMH includes asthma and HLD.    PT Comments    Patient received in bed, c/o HA and knee pain as well as nausea; reports very high pain levels but does not demonstrate distress or behaviors consistent with reported pain levels. Able to mobilize on a min guard to MinA basis with RW, needed heavy cues to correct gait mechanics during mobility however demonstrated limited carryover and inclusion of gait cues during ambulation. Left up in recliner with all needs met, RN present and attending. Plan to f/u this afternoon for HEP and stair training.     Follow Up Recommendations  Follow surgeon's recommendation for DC plan and follow-up therapies     Equipment Recommendations  None recommended by PT    Recommendations for Other Services       Precautions / Restrictions Precautions Precautions: Knee Precaution Booklet Issued: No Precaution Comments: Verbally reviewed knee precautions. Required Braces or Orthoses: Knee Immobilizer - Left Restrictions Weight Bearing Restrictions: No LLE Weight Bearing: Weight bearing as tolerated    Mobility  Bed Mobility Overal bed mobility: Needs Assistance Bed Mobility: Supine to Sit     Supine to sit: Min assist     General bed mobility comments: to manage L LE    Transfers Overall transfer level: Needs assistance Equipment used: Rolling walker (2 wheeled) Transfers: Sit to/from Stand Sit to Stand: Min guard         General transfer comment: min guard for safety with boosting up to standing, good carryover of hand placement from yesterday  Ambulation/Gait Ambulation/Gait assistance: Min guard Gait Distance (Feet): 80 Feet Assistive device: Rolling walker (2 wheeled) Gait Pattern/deviations: Step-to pattern;Decreased step length - right;Decreased  step length - left;Decreased stance time - left;Decreased weight shift to left;Antalgic;Trunk flexed;Drifts right/left Gait velocity: Decreased   General Gait Details: slow and steady with RW, repeated cues for upright posture, heel-toe pattern, step through pattern however displayed limited performance and carryover   Stairs             Wheelchair Mobility    Modified Rankin (Stroke Patients Only)       Balance Overall balance assessment: Needs assistance Sitting-balance support: No upper extremity supported;Feet supported Sitting balance-[name]: Good     Standing balance support: Bilateral upper extremity supported;During functional activity Standing balance-[name]: Fair Standing balance comment: reliant on BUE support during gait                            Cognition Arousal/Alertness: Awake/alert Behavior During Therapy: Flat affect Overall Cognitive Status: Within Functional Limits for tasks assessed                                 General Comments: anxious about mobility      Exercises Total Joint Exercises Goniometric ROM: estimated AROM L knee: approximately 10 degrees extension to 20-25 degrees flexion at best, pain limited    General Comments        Pertinent Vitals/Pain Pain Assessment: 0-10 Pain Score: 10-Worst pain ever (but no physical or behavioral signs of distress of pain truly being this high) Faces Pain Scale: Hurts little more Pain Location: L knee Pain Descriptors / Indicators: Aching;Operative site guarding Pain  Intervention(s): Monitored during session;Repositioned;RN gave pain meds during session    [place] expects to be discharged to:: Private residence Living Arrangements: Spouse/significant other Available Help at Discharge: Family Type of Home: House Home Access: Stairs to enter Entrance Stairs-Rails: Right;Left Home Layout: One level Home Equipment: Environmental consultant - 2  wheels;Bedside commode;Shower seat      Prior Function Level of Independence: Independent          PT Goals (current goals can now be found in the care plan section) Acute Rehab PT Goals Patient Stated Goal: to go home PT Goal Formulation: With patient Time For Goal Achievement: 09/21/20 Potential to Achieve Goals: Fair Progress towards PT goals: Progressing toward goals    Frequency    7X/week      PT Plan Current plan remains appropriate    Co-evaluation              AM-PAC PT "6 Clicks" Mobility   Outcome Measure  Help needed turning from your back to your side while in a flat bed without using bedrails?: A Little Help needed moving from lying on your back to sitting on the side of a flat bed without using bedrails?: A Little Help needed moving to and from a bed to a chair (including a wheelchair)?: A Little Help needed standing up from a chair using your arms (e.g., wheelchair or bedside chair)?: A Little Help needed to walk in hospital room?: A Little Help needed climbing 3-5 steps with a railing? : A Little 6 Click Score: 18    End of Session Equipment Utilized During Treatment: Gait belt;Left knee immobilizer Activity Tolerance: Patient tolerated treatment well Patient left: in chair;with call bell/phone within reach Nurse Communication: Mobility status PT Visit Diagnosis: Unsteadiness on feet (R26.81);Muscle weakness (generalized) (M62.81)     Time: 4696-2952 PT Time Calculation (min) (ACUTE ONLY): 14 min  Charges:  $Gait Training: 8-22 mins                     Windell Norfolk, DPT, PN1   Supplemental Physical Therapist [place]    Pager [phone] Acute Rehab Office [phone]

## 2020-09-09 LAB — CBC
HCT: 35.8 % — ABNORMAL LOW (ref 36.0–46.0)
Hemoglobin: 11.9 g/dL — ABNORMAL LOW (ref 12.0–15.0)
MCH: 30.7 pg (ref 26.0–34.0)
MCHC: 33.2 g/dL (ref 30.0–36.0)
MCV: 92.5 fL (ref 80.0–100.0)
Platelets: 190 10*3/uL (ref 150–400)
RBC: 3.87 MIL/uL (ref 3.87–5.11)
RDW: 12 % (ref 11.5–15.5)
WBC: 8 10*3/uL (ref 4.0–10.5)
nRBC: 0 % (ref 0.0–0.2)

## 2020-09-09 MED ORDER — OXYCODONE-ACETAMINOPHEN 5-325 MG PO TABS: 2.0000 | ORAL_TABLET | Freq: Four times a day (QID) | ORAL | 0 refills | Status: DC | PRN

## 2020-09-09 MED ORDER — OXYCODONE-ACETAMINOPHEN 5-325 MG PO TABS: 1.0000 | ORAL_TABLET | Freq: Four times a day (QID) | ORAL | 0 refills | Status: DC | PRN

## 2020-09-09 NOTE — Progress Notes (Addendum)
Physical Therapy Treatment Patient Details Name: Carol Roach MRN: [id number] DOB: 08-12-1945 Today's Date: 09/09/2020    History of Present Illness Pt is a 75 y/o female s/p L TKA. PMH includes asthma and HLD.    PT Comments    Focused morning session on reviewing all HEP exercises provided in handout and setting up pt with CPM machine to improve joint mobility and leg strength. Increased CPM setting to 0-90 per request by Orthopedic PA. Pt performed several reps of each exercise in handout for warm-up prior to standing and attempting gait. Pt limited in gait progression by reports of dizziness as she only ambulated ~30 ft in her room before needing to sit. Pt educated on possible medication causes of dizziness as her BP appeared in her appropriate range. Will continue to follow acutely. Left pt in CPM machine with RN in room and RN educated on set-up and timeline desire for use of CPM.   Follow Up Recommendations  Follow surgeon's recommendation for DC plan and follow-up therapies     Equipment Recommendations  None recommended by PT    Recommendations for Other Services       Precautions / Restrictions Precautions Precautions: Knee Precaution Booklet Issued: No Precaution Comments: Verbally reviewed knee precautions. Required Braces or Orthoses: Knee Immobilizer - Left Restrictions Weight Bearing Restrictions: Yes LLE Weight Bearing: Weight bearing as tolerated    Mobility  Bed Mobility Overal bed mobility: Needs Assistance Bed Mobility: Sit to Supine     Supine to sit: Min guard Sit to supine: Min assist   General bed mobility comments: MinA to manage L leg back to bed. Min guard for safety with L leg transitioning supine > sit L EOB with extra time and use of rails.     Transfers Overall transfer level: Needs assistance Equipment used: Rolling walker (2 wheeled) Transfers: Sit to/from Stand Sit to Stand: Min guard         General transfer comment: Min  guard to get fully upright and management of L LE, cueing to push from EOB. Cues to bring L leg anteriorly for comfort prior to returning to sit.  Ambulation/Gait Ambulation/Gait assistance: Min guard Gait Distance (Feet): 30 ft Assistive device: Rolling walker (2 wheeled) Gait Pattern/deviations: Step-to pattern;Decreased step length - right;Decreased step length - left;Decreased stance time - left;Decreased weight shift to left;Antalgic;Trunk flexed;Drifts right/left  Gait Velocity: decreased Gait velocity interpretation: <1.31 ft/sec, indicative of household ambulator  General Gait Details: Cueing for upright posture, heel-toe pattern, and step-through gait. Pt became dizzy and quickly returned to bed, thus ceased further gait at this time. No LOB, min guard for safety.   Stairs   Wheelchair Mobility    Modified Rankin (Stroke Patients Only)       Balance Overall balance assessment: Needs assistance Sitting-balance support: No upper extremity supported;Feet supported Sitting balance-[name]: Good     Standing balance support: Bilateral upper extremity supported;Single extremity supported Standing balance-[name]: Fair Standing balance comment: reliant on BUE support in standing                            Cognition Arousal/Alertness: Awake/alert Behavior During Therapy: Flat affect Overall Cognitive Status: Within Functional Limits for tasks assessed                                 General Comments: anxious about mobility  Exercises Total Joint Exercises Ankle Circles/Pumps: AROM;Both;10 reps Quad Sets: Strengthening;Both;5 reps Short Arc Quad: Strengthening;Left;5 reps;Supine Heel Slides: AAROM;Left;AROM;10 reps (5 reps AAROM and 5 reps AROM) Hip ABduction/ADduction: Strengthening;Left;5 reps;Supine;AROM Straight Leg Raises: AAROM;Strengthening;Left;5 reps;Supine Long Arc Quad: Strengthening;Left;5 reps;Seated Knee Flexion:  AROM;PROM;Left;10 reps;Seated (AROM knee flexion 5 reps then PROM provided by pt's R leg into L knee flexion 5 reps)    General Comments General comments (skin integrity, edema, etc.): Edcuated pt on knee precautions and the provided HEP handout, having pt perform several of each exercise for learning purposes; connected pt in CPM at end of session with RN present, educating RN for proper set-up, and increased knee flexion to 90 per orthopedic PA's request      Pertinent Vitals/Pain Pain Assessment: Faces Faces Pain Scale: Hurts even more Pain Location: L knee Pain Descriptors / Indicators: Aching;Operative site guarding Pain Intervention(s): Limited activity within patient's tolerance;Monitored during session;Premedicated before session;Repositioned    Home Living                      Prior Function            PT Goals (current goals can now be found in the care plan section) Acute Rehab PT Goals Patient Stated Goal: to decrease her pain PT Goal Formulation: With patient Time For Goal Achievement: 09/21/20 Potential to Achieve Goals: Fair Progress towards PT goals: Progressing toward goals    Frequency    7X/week      PT Plan Current plan remains appropriate    Co-evaluation              AM-PAC PT "6 Clicks" Mobility   Outcome Measure  Help needed turning from your back to your side while in a flat bed without using bedrails?: A Little Help needed moving from lying on your back to sitting on the side of a flat bed without using bedrails?: A Little Help needed moving to and from a bed to a chair (including a wheelchair)?: A Little Help needed standing up from a chair using your arms (e.g., wheelchair or bedside chair)?: A Little Help needed to walk in hospital room?: A Little Help needed climbing 3-5 steps with a railing? : A Little 6 Click Score: 18    End of Session Equipment Utilized During Treatment: Gait belt;Left knee immobilizer Activity  Tolerance: Patient limited by pain; Other (comment) (limited by dizziness) Patient left: in bed;with call bell/phone within reach;with bed alarm set;with nursing/sitter in room Nurse Communication: Mobility status;Other (comment) (dizziness, BP 112/high 50s, proper set-up of CPM) PT Visit Diagnosis: Unsteadiness on feet (R26.81);Muscle weakness (generalized) (M62.81);Other abnormalities of gait and mobility (R26.89);Difficulty in walking, not elsewhere classified (R26.2);Pain Pain - Right/Left: Left Pain - part of body: Knee     Time: 2595-6387 PT Time Calculation (min) (ACUTE ONLY): 54 min  Charges: $Therapeutic Exercise: 38-52 mins $Therapeutic Activity: 8-22 mins                   Moishe Spice, PT, DPT Acute Rehabilitation Services  Pager: [phone] Office: [phone]    Orvan Falconer 09/09/2020, 2:22 PM

## 2020-09-09 NOTE — Progress Notes (Signed)
NURSING PROGRESS NOTE  Carol Roach [id number] Discharge Data: 09/09/2020 5:14 PM Attending Provider: [name], MD [id number], Candida Peeling, MD     Carol Roach discharged to her private residence per MD order.  Discussed with the patient and her husband the After Visit Summary and all questions fully answered. Pt and her husband in a hurry to get to [facility] to pick up pt prescription.  All IV's discontinued with no bleeding noted. All belongings returned to patient for patient to take home.   Last Vital Signs:  Blood pressure (!) 121/50, pulse (!) 101, temperature 98.6 F (37 C), temperature source Oral, resp. rate 18, height 5' 5"  (1.651 m), weight 64 kg, SpO2 96 %.  Discharge Medication List Allergies as of 09/09/2020      Reactions   Codeine    Headache    Latex    Irritates skin       Medication List    STOP taking these medications   Aleve PM 220-25 MG Tabs Generic drug: Naproxen Sod-diphenhydrAMINE   linaclotide 72 MCG capsule Commonly known as: Linzess     TAKE these medications   aspirin EC 325 MG tablet Take 1 tablet (325 mg total) by mouth daily. MUST TAKE AT LEAST 4 WEEKS POSTOP FOR DVT PROPHYLAXIS.   Back & Body Extra Strength 500-32.5 MG Tabs Generic drug: Aspirin-Caffeine Take 1 tablet by mouth daily as needed (pain).   betamethasone valerate 0.1 % cream Commonly known as: VALISONE Apply 1 application topically daily as needed (dryness).   Calcium-Vitamin D3 600-200 MG-UNIT Tabs Take 1 tablet by mouth daily.   CENTRUM SILVER ADULT 50+ PO Take 1 tablet by mouth daily.   cetirizine 10 MG tablet Commonly known as: ZYRTEC Take 10 mg by mouth daily as needed for allergies.   magnesium oxide 400 MG tablet Commonly known as: MAG-OX Take 400 mg by mouth at bedtime.   methocarbamol 500 MG tablet Commonly known as: Robaxin Take 1 tablet (500 mg total) by mouth every 6 (six) hours as needed for muscle spasms.   montelukast 10 MG  tablet Commonly known as: SINGULAIR Take 10 mg by mouth daily.   oxyCODONE-acetaminophen 5-325 MG tablet Commonly known as: Percocet Take 1-2 tablets by mouth every 6 (six) hours as needed for severe pain.   PRESCRIPTION MEDICATION Place 1 application rectally daily as needed (anal fissures). Nitroglycerin 1% ointment   rosuvastatin 5 MG tablet Commonly known as: CRESTOR Take 5 mg by mouth daily.   vitamin B-12 500 MCG tablet Commonly known as: CYANOCOBALAMIN Take 1,000 mcg by mouth 3 (three) times a week.   vitamin C 1000 MG tablet Take 1,000 mg by mouth daily.   Vitamin D 50 MCG (2000 UT) tablet Take 2,000 Units by mouth daily.

## 2020-09-09 NOTE — Progress Notes (Signed)
Subjective: Still c/o nausea. No having pain relief with ultram, so percocet was started.  Denies headache. Per PT, patient had some dizziness when up and ambulating.  Slow progression with PT.  Has ambulated to door in room.    Objective: Vital signs in last 24 hours: Temp:  [97.4 F (36.3 C)-99.2 F (37.3 C)] 97.8 F (36.6 C) (02/18 0836) Pulse Rate:  [73-92] 91 (02/18 0836) Resp:  [16-18] 16 (02/18 0836) BP: (102-126)/(48-85) 119/53 (02/18 0836) SpO2:  [95 %-100 %] 100 % (02/18 0836)  Intake/Output from previous day: 02/17 0701 - 02/18 0700 In: 240 [P.O.:240] Out: -  Intake/Output this shift: No intake/output data recorded.  Recent Labs    09/08/20 0410 09/09/20 0326  HGB 12.6 11.9*   Recent Labs    09/08/20 0410 09/09/20 0326  WBC 10.2 8.0  RBC 4.07 3.87  HCT 38.2 35.8*  PLT 201 190   Recent Labs    09/07/20 1023 09/08/20 0410  NA 140 133*  K 3.9 4.1  CL 106 100  CO2 23 24  BUN 12 12  CREATININE 0.74 0.70  GLUCOSE 84 140*  CALCIUM 9.4 8.8*   No results for input(s): LABPT, INR in the last 72 hours.  Exam Alert and oriented.  Wound looks good.  Staples intact.  No drainage or signs of infection. Calf nontender.     Assessment/Plan: Slow progression with PT.  Will see how she does this afternoon and therapist will contact me.  Patient also not really wanting to use CPM and I again discussed importance of this.  I also discussed with patient that changing pain medication to percocet will help with left knee pain but she will likely continue to have issue with nausea.    Benjiman Core 09/09/2020, 10:31 AM

## 2020-09-09 NOTE — Progress Notes (Signed)
Husband wants to make it to [facility] in [place] before it closes to get her Rx filled.

## 2020-09-09 NOTE — Progress Notes (Addendum)
Occupational Therapy Treatment Patient Details Name: Carol Roach MRN: [id number] DOB: [birth year] Today's Date: 09/09/2020    History of present illness Pt is a 75 y/o female s/p L TKA. PMH includes asthma and HLD.   OT comments  Pt progressing towards established OT goals. Performing functional mobility with Min Guard A and RW. Providing education on shower transfer and pt completing with Min Guard A demonstrating understanding. Pt performing functional mobility in hallway with Min Guard A and RW. Continue to recommend dc to home with follow up by PT. Will continue to follow acutely as admitted.    Follow Up Recommendations  Follow surgeon's recommendation for DC plan and follow-up therapies;No OT follow up;Supervision/Assistance - 24 hour    Equipment Recommendations  None recommended by OT    Recommendations for Other Services PT consult    Precautions / Restrictions Precautions Precautions: Knee Precaution Booklet Issued: No Precaution Comments: Verbally reviewed knee precautions. Required Braces or Orthoses: Knee Immobilizer - Left Restrictions Weight Bearing Restrictions: Yes LLE Weight Bearing: Weight bearing as tolerated       Mobility Bed Mobility Overal bed mobility: Needs Assistance Bed Mobility: Supine to Sit;Sit to Supine     Supine to sit: Supervision Sit to supine: Supervision   General bed mobility comments: Supervision for safety  Transfers Overall transfer level: Needs assistance Equipment used: Rolling walker (2 wheeled) Transfers: Sit to/from Stand Sit to Stand: Min guard         General transfer comment: Min guard to get fully upright and management of L LE, cueing to push from EOB    Balance Overall balance assessment: Needs assistance Sitting-balance support: No upper extremity supported;Feet supported Sitting balance-[name]: Good     Standing balance support: Bilateral upper extremity supported;During functional activity;No  upper extremity supported Standing balance-[name]: Fair                             ADL either performed or assessed with clinical judgement   ADL Overall ADL's : Needs assistance/impaired                         Toilet Transfer: Min guard;Ambulation;RW (simulated in room)       Tub/ Shower Transfer: Min guard;Ambulation;Rolling walker;Walk-in shower   Functional mobility during ADLs: Min guard;Rolling walker General ADL Comments: Providing education on safe shower trasnfer     Vision       Perception     Praxis      Cognition Arousal/Alertness: Awake/alert Behavior During Therapy: Flat affect Overall Cognitive Status: Within Functional Limits for tasks assessed                                 General Comments: anxious about mobility        Exercises     Shoulder Instructions       General Comments      Pertinent Vitals/ Pain       Pain Assessment: Faces Faces Pain Scale: Hurts little more Pain Location: L knee Pain Descriptors / Indicators: Aching;Operative site guarding Pain Intervention(s): Monitored during session;Repositioned;Limited activity within patient's tolerance  Home Living  Prior Functioning/Environment              Frequency  Min 2X/week        Progress Toward Goals  OT Goals(current goals can now be found in the care plan section)  Progress towards OT goals: Progressing toward goals  Acute Rehab OT Goals Patient Stated Goal: to go home OT Goal Formulation: With patient Time For Goal Achievement: 09/22/20 Potential to Achieve Goals: Good ADL Goals Pt Will Perform Lower Body Dressing: with modified independence;sit to/from stand Pt Will Transfer to Toilet: with modified independence;ambulating;bedside commode Pt Will Perform Tub/Shower Transfer: rolling walker;with supervision;Shower transfer;shower seat  Plan Discharge plan  remains appropriate    Co-evaluation                 AM-PAC OT "6 Clicks" Daily Activity     Outcome Measure   Help from another person eating meals?: None Help from another person taking care of personal grooming?: A Little Help from another person toileting, which includes using toliet, bedpan, or urinal?: A Little Help from another person bathing (including washing, rinsing, drying)?: A Little Help from another person to put on and taking off regular upper body clothing?: None Help from another person to put on and taking off regular lower body clothing?: A Little 6 Click Score: 20    End of Session Equipment Utilized During Treatment: Rolling walker;Gait belt  OT Visit Diagnosis: Other abnormalities of gait and mobility (R26.89);Unsteadiness on feet (R26.81);Muscle weakness (generalized) (M62.81);Pain Pain - Right/Left: Left Pain - part of body: Knee   Activity Tolerance Patient tolerated treatment well   Patient Left in chair;with call bell/phone within reach   Nurse Communication Mobility status        Time: 4098-1191 OT Time Calculation (min): 24 min  Charges: OT General Charges $OT Visit: 1 Visit OT Treatments $Self Care/Home Management : 23-37 mins  [place], OTR/L Acute Rehab Pager: [phone] Office: [place] 09/09/2020, 12:51 PM

## 2020-09-09 NOTE — Progress Notes (Signed)
Pt transferred from [facility] S/P Left knee surgery, alert and oriented, c/o of slight pain in the left knee, immobilizer in place, pt settled in bed with call light at bedside, safety concern explained and initiated accordingly, pt was however reassured and will continue to monitor. Carol Roach, Tanetta Fuhriman Efe

## 2020-09-09 NOTE — Progress Notes (Signed)
Physical Therapy Treatment Patient Details Name: Carol Roach MRN: [id number] DOB: [birth year] Today's Date: 09/09/2020    History of Present Illness Pt is a 75 y/o female s/p L TKA. PMH includes asthma and HLD.    PT Comments    Pt making significant progress with mobility during afternoon session as she was able to ambulate up to ~200 ft with a RW and negotiate 4 stairs with bil hand rails and only min guard assist for safety without any seated rest breaks. She did need several brief standing rest breaks intermittently though. Still displays antalgic gait pattern due to L knee pain and limited ROM. Pt needs occasional reminders to descend stairs leading with her L. Pt's husband educated on proper, safe guarding during stairs and mobility in prep for eventual d/c home. Will continue to follow acutely.     Follow Up Recommendations  Follow surgeon's recommendation for DC plan and follow-up therapies     Equipment Recommendations  None recommended by PT    Recommendations for Other Services       Precautions / Restrictions Precautions Precautions: Knee Precaution Booklet Issued: No Precaution Comments: Verbally reviewed knee precautions. Required Braces or Orthoses: Knee Immobilizer - Left Restrictions Weight Bearing Restrictions: Yes LLE Weight Bearing: Weight bearing as tolerated    Mobility  Bed Mobility Overal bed mobility: Needs Assistance Bed Mobility: Sit to Supine     Supine to sit: Min guard Sit to supine: Min assist   General bed mobility comments: MinA to manage L leg back to bed.    Transfers Overall transfer level: Needs assistance Equipment used: Rolling walker (2 wheeled) Transfers: Sit to/from Stand Sit to Stand: Min guard         General transfer comment: Pt received standing just exiting bathroom with RN.  Ambulation/Gait Ambulation/Gait assistance: Min guard Gait Distance (Feet): 200 Feet Assistive device: Rolling walker (2 wheeled) Gait  Pattern/deviations: Step-to pattern;Decreased step length - right;Decreased step length - left;Decreased stance time - left;Decreased weight shift to left;Antalgic;Trunk flexed;Drifts right/left Gait velocity: Decreased Gait velocity interpretation: <1.31 ft/sec, indicative of household ambulator General Gait Details: Cueing for upright posture, heel-toe pattern, and step-through gait, with mod success as distance progressed. Speed increased with distance also. Several moments of brief standing rest breaks. No LOB, min guard for safety.   Stairs Stairs: Yes Stairs assistance: Min guard Stair Management: Two rails Number of Stairs: 4 General stair comments: Cueing for sequencing, leading up with R and down with L. Moments of poor carryover with leading down with L, needing reminders occasionally. Step-to pattern with bil rails, educating pt's husband on proper safe guarding for home. Pt's husband guarded the final 2 stairs.   Wheelchair Mobility    Modified Rankin (Stroke Patients Only)       Balance Overall balance assessment: Needs assistance Sitting-balance support: No upper extremity supported;Feet supported Sitting balance-[name]: Good     Standing balance support: Bilateral upper extremity supported;Single extremity supported Standing balance-[name]: Fair Standing balance comment: reliant on 1-2 UE support in standing                            Cognition Arousal/Alertness: Awake/alert Behavior During Therapy: Flat affect Overall Cognitive Status: Within Functional Limits for tasks assessed                                 General Comments:  anxious about mobility      Exercises    General Comments       Pertinent Vitals/Pain Pain Assessment: Faces Faces Pain Scale: Hurts even more Pain Location: L knee Pain Descriptors / Indicators: Aching;Operative site guarding Pain Intervention(s): Limited activity within patient's  tolerance;Monitored during session;Premedicated before session;Repositioned    Home Living                      Prior Function            PT Goals (current goals can now be found in the care plan section) Acute Rehab PT Goals Patient Stated Goal: to decrease her pain PT Goal Formulation: With patient Time For Goal Achievement: 09/21/20 Potential to Achieve Goals: Fair Progress towards PT goals: Progressing toward goals    Frequency    7X/week      PT Plan Current plan remains appropriate    Co-evaluation              AM-PAC PT "6 Clicks" Mobility   Outcome Measure  Help needed turning from your back to your side while in a flat bed without using bedrails?: A Little Help needed moving from lying on your back to sitting on the side of a flat bed without using bedrails?: A Little Help needed moving to and from a bed to a chair (including a wheelchair)?: A Little Help needed standing up from a chair using your arms (e.g., wheelchair or bedside chair)?: A Little Help needed to walk in hospital room?: A Little Help needed climbing 3-5 steps with a railing? : A Little 6 Click Score: 18    End of Session Equipment Utilized During Treatment: Gait belt;Left knee immobilizer Activity Tolerance: Patient tolerated treatment well Patient left: in bed;with call bell/phone within reach;with bed alarm set;with family/visitor present Nurse Communication: Mobility status;Other (comment) (dizziness, BP 112/high 50s, proper set-up of CPM) PT Visit Diagnosis: Unsteadiness on feet (R26.81);Muscle weakness (generalized) (M62.81);Other abnormalities of gait and mobility (R26.89);Difficulty in walking, not elsewhere classified (R26.2);Pain Pain - Right/Left: Left Pain - part of body: Knee     Time: 6122-4497 PT Time Calculation (min) (ACUTE ONLY): 18 min  Charges:  $Gait Training: 8-22 mins                     Moishe Spice, PT, DPT Acute Rehabilitation Services  Pager:  [phone] Office: [phone]    Orvan Falconer 09/09/2020, 2:20 PM

## 2020-09-21 ENCOUNTER — Ambulatory Visit (INDEPENDENT_AMBULATORY_CARE_PROVIDER_SITE_OTHER): Payer: Medicare Other | Admitting: Orthopaedic Surgery

## 2020-09-21 ENCOUNTER — Ambulatory Visit (INDEPENDENT_AMBULATORY_CARE_PROVIDER_SITE_OTHER): Payer: Medicare Other

## 2020-09-21 ENCOUNTER — Encounter: Payer: Self-pay | Admitting: Orthopaedic Surgery

## 2020-09-21 VITALS — BP 136/83 | HR 101 | Ht 65.0 in | Wt 141.0 lb

## 2020-09-21 DIAGNOSIS — Z96652 Presence of left artificial knee joint: Secondary | ICD-10-CM | POA: Diagnosis not present

## 2020-09-21 DIAGNOSIS — M545 Low back pain, unspecified: Secondary | ICD-10-CM

## 2020-09-21 DIAGNOSIS — G8929 Other chronic pain: Secondary | ICD-10-CM | POA: Diagnosis not present

## 2020-09-21 MED ORDER — TRAMADOL HCL 50 MG PO TABS: 50.0000 mg | ORAL_TABLET | Freq: Four times a day (QID) | ORAL | 0 refills | Status: DC | PRN

## 2020-09-21 NOTE — Progress Notes (Signed)
Post-Op Visit Note   Patient: Carol Roach           Date of Birth: 10-13-1945           MRN: [id number] Visit Date: 09/21/2020 PCP: Donalynn Furlong, MD   Assessment & Plan: Postop left total knee arthroplasty.  She had problems with nausea constipation with Percocet.  Tramadol did better if she still taking left muscle relaxant.  Full extension flexing to almost 100 degrees.  Mild knee effusion still has some quad weakness.  Staples removed Steri-Strips applied.  Patient's had increased back pain buttock pain on the left.  X-rays demonstrate some scoliosis with disc degeneration likely aggravated with altered gait using a walker.  She is starting outpatient therapy today and thinks some of the home therapy exercises she was doing flared up her back.  Knee pain itself has been mild to moderate only.  Chief Complaint:  Chief Complaint  Patient presents with  . Left Knee - Routine Post Op    09/07/2020 Left TKA   Visit Diagnoses:  1. S/P total knee arthroplasty, left   2. Chronic bilateral low back pain, unspecified whether sciatica present     Plan: Transition outpatient therapy.  Recheck 4 weeks in [facility].  X-rays of her knee look good.  Lumbar x-rays unchanged no evidence of compression fracture.  She does have some scoliosis and disc degeneration worse at L3-4 unchanged from previous images 2021.  Follow-Up Instructions: Return in about 4 weeks (around 10/19/2020).   Orders:  Orders Placed This Encounter  Procedures  . XR Knee 1-2 Views Left  . XR Lumbar Spine 2-3 Views   Meds ordered this encounter  Medications  . traMADol (ULTRAM) 50 MG tablet    Sig: Take 1 tablet (50 mg total) by mouth every 6 (six) hours as needed.    Dispense:  30 tablet    Refill:  0    Imaging: No results found.  PMFS History: Patient Active Problem List   Diagnosis Date Noted  . S/P total knee arthroplasty 09/08/2020  . Arthritis of left knee 09/07/2020  . Colitis   .  Constipation   . Unilateral primary osteoarthritis, left knee 06/04/2019   Past Medical History:  Diagnosis Date  . Anemia    "long time ago"  . Anxiety   . Arthritis   . Asthma   . 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 BCC (basal cell carcinoma of skin) 07/01/2007   Right Lower Med Eyelid  . Colitis    Years ago  . Heart murmur   . History of bronchoscopy   . History of claustrophobia   . Hyperlipidemia   . IBS (irritable bowel syndrome)   . PONV (postoperative nausea and vomiting)    with tubal ligation  . 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 Ulcerative colitis ([place])         Family History  Problem Relation Age of Onset  . Colitis Sister        require total colectomy. UC  . Lung cancer Sister   . Colon cancer Neg Hx     Past Surgical History:  Procedure Laterality Date  . BRONCHOSCOPY  1995  . COLONOSCOPY  2009   Dr. West Carbo: Procedure being done for surveillance for chronic ulcerative colitis.  No endoscopic  appearance of active colitis.  Random colon biopsies taken to look for dysplasia.  Biopsy showed nonspecific mild chronic inflammation of the colon  . TONSILLECTOMY    . TOTAL KNEE ARTHROPLASTY Left 09/07/2020   Procedure: LEFT TOTAL KNEE ARTHROPLASTY;  Surgeon: [name], MD;  Location: [facility];  Service: Orthopedics;  Laterality: Left;  . TUBAL LIGATION     Social History   Occupational History  . Not on file  Tobacco Use  . Smoking status: Never Smoker  . Smokeless tobacco: Never Used  Vaping Use  . Vaping Use: Former  Substance and Sexual Activity  . Alcohol use: Not Currently  . Drug use: Never  . Sexual activity: Not on file

## 2020-09-26 NOTE — Discharge Summary (Signed)
Patient ID: Carol Roach MRN: [id number] DOB/AGE: August 08, 1945 75 y.o.  Admit date: 09/07/2020 Discharge date: 09/09/2020 Admission Diagnoses:  Active Problems:   Unilateral primary osteoarthritis, left knee   Arthritis of left knee   S/P total knee arthroplasty   Discharge Diagnoses:  Active Problems:   Unilateral primary osteoarthritis, left knee   Arthritis of left knee   S/P total knee arthroplasty  status post Procedure(s): LEFT TOTAL KNEE ARTHROPLASTY  Past Medical History:  Diagnosis Date  . Anemia    "long time ago"  . Anxiety   . Arthritis   . Asthma   . 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 BCC (basal cell carcinoma of skin) 07/01/2007   Right Lower Med Eyelid  . Colitis    Years ago  . Heart murmur   . History of bronchoscopy   . History of claustrophobia   . Hyperlipidemia   . IBS (irritable bowel syndrome)   . PONV (postoperative nausea and vomiting)    with tubal ligation  . 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 Ulcerative colitis ([place])         Surgeries: Procedure(s): LEFT TOTAL KNEE ARTHROPLASTY on 09/07/2020   Consultants:   Discharged Condition: Improved  Hospital Course: Carol Roach is an 75 y.o. female who was admitted 09/07/2020 for operative treatment of left knee djd. Patient failed conservative treatments (please see the history and physical for the specifics) and had severe unremitting pain that affects sleep, daily activities and work/hobbies. After pre-op clearance, the patient was taken to the operating room on 09/07/2020 and underwent  Procedure(s): LEFT TOTAL KNEE ARTHROPLASTY.    Patient was given perioperative antibiotics:  Anti-infectives (From admission, onward)   Start     Dose/Rate Route Frequency Ordered Stop   09/07/20 2000  ceFAZolin (ANCEF) IVPB 1 g/50 mL premix        1 g 100 mL/hr  over 30 Minutes Intravenous Every 8 hours 09/07/20 1624 09/08/20 1333   09/07/20 1030  ceFAZolin (ANCEF) IVPB 2g/100 mL premix        2 g 200 mL/hr over 30 Minutes Intravenous On call to O.R. 09/07/20 1022 09/07/20 1250       Patient was given sequential compression devices and early ambulation to prevent DVT.   Patient benefited maximally from hospital stay and there were no complications. At the time of discharge, the patient was urinating/moving their bowels without difficulty, tolerating a regular diet, pain is controlled with oral pain medications and they have been cleared by PT/OT.   Recent vital signs: No data found.   Recent laboratory studies: No results for input(s): WBC, HGB, HCT, PLT, NA, K, CL, CO2, BUN, CREATININE, GLUCOSE, INR, CALCIUM in the last 72 hours.  Invalid input(s): PT, 2   Discharge Medications:   Allergies as of 09/09/2020      Reactions   Codeine    Headache    Latex    Irritates skin       Medication List    STOP taking these medications   Aleve PM 220-25 MG Tabs Generic drug: Naproxen Sod-diphenhydrAMINE   linaclotide 72 MCG capsule Commonly known as: Linzess     TAKE these medications   aspirin EC 325 MG tablet Take 1 tablet (325 mg total) by mouth daily. MUST TAKE AT LEAST 4  WEEKS POSTOP FOR DVT PROPHYLAXIS.   Back & Body Extra Strength 500-32.5 MG Tabs Generic drug: Aspirin-Caffeine Take 1 tablet by mouth daily as needed (pain).   betamethasone valerate 0.1 % cream Commonly known as: VALISONE Apply 1 application topically daily as needed (dryness).   Calcium-Vitamin D3 600-200 MG-UNIT Tabs Take 1 tablet by mouth daily.   CENTRUM SILVER ADULT 50+ PO Take 1 tablet by mouth daily.   cetirizine 10 MG tablet Commonly known as: ZYRTEC Take 10 mg by mouth daily as needed for allergies.   magnesium oxide 400 MG tablet Commonly known as: MAG-OX Take 400 mg by mouth at bedtime.   methocarbamol 500 MG tablet Commonly known as:  Robaxin Take 1 tablet (500 mg total) by mouth every 6 (six) hours as needed for muscle spasms.   montelukast 10 MG tablet Commonly known as: SINGULAIR Take 10 mg by mouth daily.   PRESCRIPTION MEDICATION Place 1 application rectally daily as needed (anal fissures). Nitroglycerin 1% ointment   rosuvastatin 5 MG tablet Commonly known as: CRESTOR Take 5 mg by mouth daily.   vitamin B-12 500 MCG tablet Commonly known as: CYANOCOBALAMIN Take 1,000 mcg by mouth 3 (three) times a week.   vitamin C 1000 MG tablet Take 1,000 mg by mouth daily.   Vitamin D 50 MCG (2000 UT) tablet Take 2,000 Units by mouth daily.       Diagnostic Studies: DG Knee Left Port  Result Date: 09/07/2020 CLINICAL DATA:  Status post total knee replaced EXAM: PORTABLE LEFT KNEE - 1-2 VIEW COMPARISON:  None. FINDINGS: Frontal and lateral views were obtained. Patient is status post total knee replacement with femoral and tibial prosthetic components well-seated. No fracture or dislocation. No erosive change. There is soft tissue air within the joint, an expected postoperative finding. IMPRESSION: Status post total knee replacement with prosthetic components well-seated. No fracture or dislocation. Acute postoperative changes noted. Electronically Signed   By: Lowella Grip III M.D.   On: 09/07/2020 15:32   XR Knee 1-2 Views Left  Result Date: 09/21/2020 AP lateral left knee x-rays obtained reviewed this shows well-positioned left total knee arthroplasty. No complications. Good alignment. Pression: Satisfactory left total knee arthroplasty.  XR Lumbar Spine 2-3 Views  Result Date: 09/21/2020 AP lateral lumbar spine x-rays obtained reviewed this demonstrates some lumbar curvature unchanged from images 2021. There is narrowing L3-4 unchanged. No spondylolisthesis negative for acute fracture. 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      Follow-up Information    Schedule an  appointment as soon as possible for a visit with [name], MD.   Specialty: Orthopedic Surgery Why: need return office visit 2 weeks postop Contact information: [place] [address] [address] Follow up.   Why: [facility] will contact you to schedule your first home health visit.  Please call them if you have not been contacted within 48 hours.  Contact information: [address]  [place] [address] P: [phone] F: [phone].               Discharge Plan:  discharge to home  Disposition:     Signed: Benjiman Core  09/26/2020, 2:26 PM

## 2020-09-30 NOTE — Progress Notes (Signed)
75 year old female history of end-stage DJD left knee comes in for preop evaluation.  Knee symptoms unchanged from previous visit.  She is wanting to proceed with left total knee replacement as scheduled.  Today history physical performed.  On exam positive for 2-3 systolic murmur.  He received preop cardiac clearance.  Today review of systems negative.  Surgical procedure discussed.  All questions answered.

## 2020-10-06 ENCOUNTER — Ambulatory Visit (INDEPENDENT_AMBULATORY_CARE_PROVIDER_SITE_OTHER): Payer: Medicare Other | Admitting: Orthopaedic Surgery

## 2020-10-06 ENCOUNTER — Other Ambulatory Visit: Payer: Self-pay

## 2020-10-06 DIAGNOSIS — M545 Low back pain, unspecified: Secondary | ICD-10-CM

## 2020-10-06 DIAGNOSIS — G8929 Other chronic pain: Secondary | ICD-10-CM

## 2020-10-06 MED ORDER — DIAZEPAM 5 MG PO TABS: ORAL_TABLET | ORAL | 0 refills | Status: DC

## 2020-10-07 NOTE — Progress Notes (Signed)
Office Visit Note   Patient: Carol Roach           Date of Birth: 30-Oct-1945           MRN: [id number] Visit Date: 10/06/2020              Requested by: Donalynn Furlong, [place] [address] PCP: Donalynn Furlong, MD   Assessment & Plan: Visit Diagnoses:  1. Chronic bilateral low back pain, unspecified whether sciatica present     Plan: Patient's been through conservative treatment for back which is gotten worse after left total knee arthroplasty rehab.  Previous x-rays showed L3-4 narrowing with some lumbar scoliosis less than 30 degrees.  She has endplate spurring and facet arthropathy without spondylolisthesis at the L3-4 level.  Patient is requesting proceeding with MRI scan lumbar spine for evaluation with a left greater than right buttocks and leg pain.  Office follow-up after scan for review.  Follow-Up Instructions: Return in about 4 weeks (around 11/03/2020).   Orders:  Orders Placed This Encounter  Procedures  . MR Lumbar Spine w/o contrast   Meds ordered this encounter  Medications  . diazepam (VALIUM) 5 MG tablet    Sig: 1 po 1 hour prior to MRI procedure. Repeat if needed.    Dispense:  3 tablet    Refill:  0      Procedures: No procedures performed   Clinical Data: No additional findings.   Subjective: Chief Complaint  Patient presents with  . Other    Patient states she is having buttock pain and would like to be referred to neurosurgery    HPI 75 year old female seen with left buttocks pain ongoing for about a month.  She has been working hard on left on the arthroplasty making good progress.  She can ambulate without her walker but has been using it as she continues to work on getting quad strength back.  She denies any numbness or tingling down to her foot.  She had a friend had seen Dr. Sherwood Gambler I discussed with her that he unfortunately has retired.  She has been doing therapy and the therapy has  helped to work on her back to some degree.  Patient has been an avid golfer.  Review of Systems you systems update unchanged since her on February 2022 total knee surgery.   Objective: Vital Signs: There were no vitals taken for this visit.  Physical Exam Constitutional:      Appearance: She is well-developed.  HENT:     Head: Normocephalic.     Right Ear: External ear normal.     Left Ear: External ear normal.  Eyes:     Pupils: Pupils are equal, round, and reactive to light.  Neck:     Thyroid: No thyromegaly.     Trachea: No tracheal deviation.  Cardiovascular:     Rate and Rhythm: Normal rate.  Pulmonary:     Effort: Pulmonary effort is normal.  Abdominal:     Palpations: Abdomen is soft.  Skin:    General: Skin is warm and dry.  Neurological:     Mental Status: She is alert and oriented to person, place, and time.  Psychiatric:        Behavior: Behavior normal.     Ortho Exam patient has some pain with straight leg raising positive sciatic notch tenderness on the left negative on the right midline knee incision looks good.  Collateral ligaments are  stable.  Knee and ankle jerk are intact.  Dorsal plantar lateral foot sensation left and right is normal. Specialty Comments:  No specialty comments available.  Imaging: No results found.   PMFS History: Patient Active Problem List   Diagnosis Date Noted  . S/P total knee arthroplasty 09/08/2020  . Arthritis of left knee 09/07/2020  . Colitis   . Constipation   . Unilateral primary osteoarthritis, left knee 06/04/2019   Past Medical History:  Diagnosis Date  . Anemia    "long time ago"  . Anxiety   . Arthritis   . Asthma   . 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 BCC (basal cell carcinoma of skin) 07/01/2007   Right Lower Med Eyelid  . Colitis    Years ago  . Heart murmur   . History of bronchoscopy    . History of claustrophobia   . Hyperlipidemia   . IBS (irritable bowel syndrome)   . PONV (postoperative nausea and vomiting)    with tubal ligation  . 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 Ulcerative colitis ([place])         Family History  Problem Relation Age of Onset  . Colitis Sister        require total colectomy. UC  . Lung cancer Sister   . Colon cancer Neg Hx     Past Surgical History:  Procedure Laterality Date  . BRONCHOSCOPY  1995  . COLONOSCOPY  2009   Dr. West Carbo: Procedure being done for surveillance for chronic ulcerative colitis.  No endoscopic appearance of active colitis.  Random colon biopsies taken to look for dysplasia.  Biopsy showed nonspecific mild chronic inflammation of the colon  . TONSILLECTOMY    . TOTAL KNEE ARTHROPLASTY Left 09/07/2020   Procedure: LEFT TOTAL KNEE ARTHROPLASTY;  Surgeon: [name], MD;  Location: [facility];  Service: Orthopedics;  Laterality: Left;  . TUBAL LIGATION     Social History   Occupational History  . Not on file  Tobacco Use  . Smoking status: Never Smoker  . Smokeless tobacco: Never Used  Vaping Use  . Vaping Use: Former  Substance and Sexual Activity  . Alcohol use: Not Currently  . Drug use: Never  . Sexual activity: Not on file

## 2020-10-13 ENCOUNTER — Telehealth: Payer: Self-pay

## 2020-10-13 NOTE — Telephone Encounter (Signed)
Patient called stated that pharmacy never received rx for valium sent on 03/17.  [facility] pharmacy to verify. Gave rx details over phone to pharmacist.

## 2020-10-20 ENCOUNTER — Other Ambulatory Visit: Payer: Self-pay

## 2020-10-20 ENCOUNTER — Ambulatory Visit (INDEPENDENT_AMBULATORY_CARE_PROVIDER_SITE_OTHER): Payer: Medicare Other | Admitting: Orthopaedic Surgery

## 2020-10-20 DIAGNOSIS — Z96652 Presence of left artificial knee joint: Secondary | ICD-10-CM

## 2020-10-20 NOTE — Progress Notes (Signed)
Post-Op Visit Note   Patient: Carol Roach           Date of Birth: [birth year]           MRN: [id number] Visit Date: 10/20/2020 PCP: Donalynn Furlong, MD   Assessment & Plan: Post total knee arthroplasty.  Quad strength is good bend is good she still needs a few degrees of extension work since she lacks about 3 degrees reaching full extension.  MRI scan shows some mild narrowing some narrowing at L3-4 lateral recess but this is mild.  No central compression.  Mild facet changes.  We discussed that she walks more and progresses back playing golf I think her back symptoms will completely settle down.  I will check her back in a month.  Chief Complaint:  Chief Complaint  Patient presents with  . Other     MRI review   Visit Diagnoses: No diagnosis found.  Plan: Return in 1 month.  MRI scan from [place] was reviewed today's.  Follow-Up Instructions: No follow-ups on file.   Orders:  No orders of the defined types were placed in this encounter.  No orders of the defined types were placed in this encounter.   Imaging: No results found.  PMFS History: Patient Active Problem List   Diagnosis Date Noted  . S/P total knee arthroplasty 09/08/2020  . Arthritis of left knee 09/07/2020  . Colitis   . Constipation   . Unilateral primary osteoarthritis, left knee 06/04/2019   Past Medical History:  Diagnosis Date  . Anemia    "long time ago"  . Anxiety   . Arthritis   . Asthma   . 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 BCC (basal cell carcinoma of skin) 07/01/2007   Right Lower Med Eyelid  . Colitis    Years ago  . Heart murmur   . History of bronchoscopy   . History of claustrophobia   . Hyperlipidemia   . IBS (irritable bowel syndrome)   . PONV (postoperative nausea and vomiting)    with tubal ligation  . Squamous cell carcinoma in situ (SCCIS)  02/12/2011   [facility] N[facility]  . Ulcerative colitis ([place])         Family History  Problem Relation Age of Onset  . Colitis Sister        require total colectomy. UC  . Lung cancer Sister   . Colon cancer Neg Hx     Past Surgical History:  Procedure Laterality Date  . BRONCHOSCOPY  1995  . COLONOSCOPY  2009   Dr. West Carbo: Procedure being done for surveillance for chronic ulcerative colitis.  No endoscopic appearance of active colitis.  Random colon biopsies taken to look for dysplasia.  Biopsy showed nonspecific mild chronic inflammation of the colon  . TONSILLECTOMY    . TOTAL KNEE ARTHROPLASTY Left 09/07/2020   Procedure: LEFT TOTAL KNEE ARTHROPLASTY;  Surgeon: [name], MD;  Location: [facility];  Service: Orthopedics;  Laterality: Left;  . TUBAL LIGATION     Social History   Occupational History  . Not on file  Tobacco Use  . Smoking status: Never Smoker  . Smokeless tobacco: Never Used  Vaping Use  . Vaping Use: Former  Substance and Sexual Activity  . Alcohol use: Not Currently  . Drug use: Never  . Sexual activity: Not on  file

## 2020-11-17 ENCOUNTER — Other Ambulatory Visit: Payer: Self-pay

## 2020-11-17 ENCOUNTER — Ambulatory Visit (INDEPENDENT_AMBULATORY_CARE_PROVIDER_SITE_OTHER): Payer: Medicare Other | Admitting: Orthopaedic Surgery

## 2020-11-17 DIAGNOSIS — Z96652 Presence of left artificial knee joint: Secondary | ICD-10-CM

## 2020-11-17 NOTE — Progress Notes (Signed)
Post-Op Visit Note   Patient: Carol Roach           Date of Birth: 14-Aug-1945           MRN: [id number] Visit Date: 11/17/2020 PCP: Donalynn Furlong, MD   Assessment & Plan: Post left total knee arthroplasty in February.  Patient played golf 9 holes twice.  Knee has full extension.  She is walk down stairs but has not gone up a full flight of stairs.  She is still working on quad strengthening she has excellent flexion.  No swelling good pain relief she states sometimes it feels a little stiff.  She is happy to be back on the golf course without pain.  Chief Complaint:  Chief Complaint  Patient presents with  . Other     F/u back/knee   Visit Diagnoses:  1. Status post total left knee replacement     Plan: Patient will continue strengthening.  She has been released by therapy and will follow-up on an as-needed basis.  Follow-Up Instructions: No follow-ups on file.   Orders:  No orders of the defined types were placed in this encounter.  No orders of the defined types were placed in this encounter.   Imaging: No results found.  PMFS History: Patient Active Problem List   Diagnosis Date Noted  . S/P total knee arthroplasty 09/08/2020  . Arthritis of left knee 09/07/2020  . Colitis   . Constipation   . Unilateral primary osteoarthritis, left knee 06/04/2019   Past Medical History:  Diagnosis Date  . Anemia    "long time ago"  . Anxiety   . Arthritis   . Asthma   . 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 BCC (basal cell carcinoma of skin) 07/01/2007   Right Lower Med Eyelid  . Colitis    Years ago  . Heart murmur   . History of bronchoscopy   . History of claustrophobia   . Hyperlipidemia   . IBS (irritable bowel syndrome)   . PONV (postoperative nausea and vomiting)    with tubal ligation  . 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 Ulcerative colitis ([place])         Family History  Problem Relation Age of Onset  . Colitis Sister        require total colectomy. UC  . Lung cancer Sister   . Colon cancer Neg Hx     Past Surgical History:  Procedure Laterality Date  . BRONCHOSCOPY  1995  . COLONOSCOPY  2009   Dr. West Carbo: Procedure being done for surveillance for chronic ulcerative colitis.  No endoscopic appearance of active colitis.  Random colon biopsies taken to look for dysplasia.  Biopsy showed nonspecific mild chronic inflammation of the colon  . TONSILLECTOMY    . TOTAL KNEE ARTHROPLASTY Left 09/07/2020   Procedure: LEFT TOTAL KNEE ARTHROPLASTY;  Surgeon: [name], MD;  Location: [facility];  Service: Orthopedics;  Laterality: Left;  . TUBAL LIGATION     Social History   Occupational History  . Not on file  Tobacco Use  . Smoking status: Never Smoker  . Smokeless tobacco: Never Used  Vaping Use  . Vaping Use: Former  Substance and Sexual Activity  . Alcohol use: Not Currently  . Drug use: Never  . Sexual activity:  Not on file

## 2021-04-20 ENCOUNTER — Encounter: Payer: Self-pay | Admitting: Orthopaedic Surgery

## 2021-04-20 ENCOUNTER — Ambulatory Visit (INDEPENDENT_AMBULATORY_CARE_PROVIDER_SITE_OTHER): Payer: Medicare Other | Admitting: Orthopaedic Surgery

## 2021-04-20 DIAGNOSIS — Z96652 Presence of left artificial knee joint: Secondary | ICD-10-CM | POA: Diagnosis not present

## 2021-04-20 NOTE — Progress Notes (Signed)
Office Visit Note   Patient: Carol Roach           Date of Birth: 03/07/1946           MRN: [id number] Visit Date: 04/20/2021              Requested by: Donalynn Furlong, [place] [address] PCP: Donalynn Furlong, MD   Assessment & Plan: Visit Diagnoses:  1. Status post total left knee replacement     Plan: Patient will work on quad strengthening finish up her quad exercise program to get back to normal quad strength and her gait should return to normal.  She has some mild hip osteoarthritis not symptomatic currently and some retrolisthesis at L3-4 without radiculopathy or stenosis.  Continue golf activity continue left quad strengthening.  Follow-Up Instructions: No follow-ups on file.   Orders:  No orders of the defined types were placed in this encounter.  No orders of the defined types were placed in this encounter.     Procedures: No procedures performed   Clinical Data: No additional findings.   Subjective: Chief Complaint  Patient presents with   Left Knee - Pain    HPI 75 year old female total knee arthroplasty left in February.  She still noticed slight limp has slight quad weakness still has minimal hop step when she goes up left foot first when she goes up a long flight of stairs she goes 1 foot at a time.  She had some x-rays of her back which showed some retrolisthesis at L3-4 and some hip osteoarthritis.  No specific groin pain.  Review of SystemsAll other systems updated unchanged from surgery in February.   Objective: Vital Signs: There were no vitals taken for this visit.  Physical Exam Constitutional:      Appearance: She is well-developed.  HENT:     Head: Normocephalic.     Right Ear: External ear normal.     Left Ear: External ear normal. There is no impacted cerumen.  Eyes:     Pupils: Pupils are equal, round, and reactive to light.  Neck:     Thyroid: No thyromegaly.     Trachea: No  tracheal deviation.  Cardiovascular:     Rate and Rhythm: Normal rate.  Pulmonary:     Effort: Pulmonary effort is normal.  Abdominal:     Palpations: Abdomen is soft.  Musculoskeletal:     Cervical back: No rigidity.  Skin:    General: Skin is warm and dry.  Neurological:     Mental Status: She is alert and oriented to person, place, and time.  Psychiatric:        Behavior: Behavior normal.    Ortho Exam nicely healed left total knee arthroplasty incision.  Good flexion full extension collateral ligaments are balanced no knee effusion.  Slight quad weakness noted with single leg step up.  We gave her some additional exercises to finish up her quad strengthening.  She can return as needed.  She has good hip range of motion no evidence of significant hip arthritis.  Negative straight leg raising.  Specialty Comments:  No specialty comments available.  Imaging: No results found.   PMFS History: Patient Active Problem List   Diagnosis Date Noted   S/P total knee arthroplasty 09/08/2020   Arthritis of left knee 09/07/2020   Colitis    Constipation    Unilateral primary osteoarthritis, left knee 06/04/2019   Past Medical History:  Diagnosis Date   Anemia    "long time ago"   Anxiety    Arthritis    Asthma    Atypical nevus 02/12/2011   Left Shoulder - Moderate and Right Buttock - Mild   Atypical nevus 10/01/2012   Left Lower Back - Severe    BCC (basal cell carcinoma of skin) 07/01/2007   Right Lower Med Eyelid   Colitis    Years ago   Heart murmur    History of bronchoscopy    History of claustrophobia    Hyperlipidemia    IBS (irritable bowel syndrome)    PONV (postoperative nausea and vomiting)    with tubal ligation   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 UC   Lung cancer Sister    Colon cancer  Neg Hx     Past Surgical History:  Procedure Laterality Date   BRONCHOSCOPY  1995   COLONOSCOPY  2009   Dr. West Carbo: Procedure being done for surveillance for chronic ulcerative colitis.  No endoscopic appearance of active colitis.  Random colon biopsies taken to look for dysplasia.  Biopsy showed nonspecific mild chronic inflammation of the colon   TONSILLECTOMY     TOTAL KNEE ARTHROPLASTY Left 09/07/2020   Procedure: LEFT TOTAL KNEE ARTHROPLASTY;  Surgeon: [name], MD;  Location: [facility];  Service: Orthopedics;  Laterality: Left;   TUBAL LIGATION     Social History   Occupational History   Not on file  Tobacco Use   Smoking status: Never   Smokeless tobacco: Never  Vaping Use   Vaping Use: Former  Substance and Sexual Activity   Alcohol use: Not Currently   Drug use: Never   Sexual activity: Not on file

## 2021-10-03 ENCOUNTER — Telehealth: Payer: Self-pay | Admitting: *Deleted

## 2021-10-03 ENCOUNTER — Ambulatory Visit (INDEPENDENT_AMBULATORY_CARE_PROVIDER_SITE_OTHER): Payer: Medicare Other | Admitting: Gastroenterology

## 2021-10-03 ENCOUNTER — Other Ambulatory Visit: Payer: Self-pay

## 2021-10-03 ENCOUNTER — Telehealth: Payer: Self-pay | Admitting: Internal Medicine

## 2021-10-03 ENCOUNTER — Encounter: Payer: Self-pay | Admitting: Gastroenterology

## 2021-10-03 VITALS — BP 118/70 | HR 87 | Temp 96.8°F | Ht 64.0 in | Wt 144.2 lb

## 2021-10-03 DIAGNOSIS — K51918 Ulcerative colitis, unspecified with other complication: Secondary | ICD-10-CM | POA: Diagnosis not present

## 2021-10-03 DIAGNOSIS — K5190 Ulcerative colitis, unspecified, without complications: Secondary | ICD-10-CM | POA: Insufficient documentation

## 2021-10-03 DIAGNOSIS — Z9189 Other specified personal risk factors, not elsewhere classified: Secondary | ICD-10-CM | POA: Diagnosis not present

## 2021-10-03 DIAGNOSIS — Z1212 Encounter for screening for malignant neoplasm of rectum: Secondary | ICD-10-CM

## 2021-10-03 DIAGNOSIS — Z1211 Encounter for screening for malignant neoplasm of colon: Secondary | ICD-10-CM | POA: Diagnosis not present

## 2021-10-03 MED ORDER — CLENPIQ 10-3.5-12 MG-GM -GM/160ML PO SOLN: 1.0000 | Freq: Once | ORAL | 0 refills | Status: AC

## 2021-10-03 NOTE — Telephone Encounter (Signed)
Called pt. She has been scheduled for TCS with propofol asa 2 Dr. Gala Romney on 4/10 at 11:15am. Per encounter form she wants short volume prep, prep all day prior. Patient aware will send rx for prep to pharmacy and will mail prep instructions. Confirmed pharmacy and confirmed address. ?

## 2021-10-03 NOTE — Telephone Encounter (Signed)
Pt called back asking to speak with scheduler regarding her procedure. [phone] ?

## 2021-10-03 NOTE — Patient Instructions (Signed)
Colonoscopy to be scheduled. See separate instructions. ?

## 2021-10-03 NOTE — Progress Notes (Signed)
? ? ? ? ?Primary Care Physician: [id number], Candida Peeling, MD ? ?Primary Gastroenterologist:  Garfield Cornea, MD ? ? ?Chief Complaint  ?Patient presents with  ? Colonoscopy  ? ? ?HPI: Carol Roach is a 76 y.o. female here for follow-up.  Last seen in January 2022 for consideration of colonoscopy at the request of Dr. Rogelia Mire.  Patient gives a history of ulcerative colitis.  Sister also with UC and had a total colectomy.  No family history of colon cancer.  Patient diagnosed with UC in the 15s.  Initially followed by Dr. West Carbo, was on Azulfidine at the time.  Denied chronic treatment for UC.  At time of last office visit she desires colonoscopy for bowel changes but declined scheduling at that time because of pending knee replacement surgery, which she completed 09/07/20. ? ?Patient states she recently saw her retired physician, Dr. Davina Poke, in the grocery store. He asked her if she ever completed her colonoscopy. When she reported that she had not, he encouraged her to follow through. She is not excited to have it done, but states the Dr. Davina Poke has never led her astray. She desires one last colonoscopy.  ? ?When patient was seen in January 2022, at that time she had 10-monthhistory of feeling like stools were incomplete.  She had had some issues with rectal pain thought to be related to a fissure as well.  Her rectal pain resolved.  She continues to have over the past 2 to 3 years, 2-3 stools every morning.  Has to go back a couple times to complete her stool.  Denies any abdominal pain, rectal pain, rectal bleeding.  No unintentional weight loss.  No upper GI symptoms. ? ? ?Records received from Dr. JBarnabas ListerSpainhour: ?  ?Colonoscopy June 2009: Surveillance for chronic ulcerative colitis.  No evidence of active ulcerative colitis.  Biopsies showed nonspecific mild chronic inflammation of the colon. ?  ?Colonoscopy April 2006: Indication for procedure, surveillance colonoscopy for ulcerative colitis.   Quiescent colitis.  Mild internal hemorrhoids.  Mild sigmoid diverticulosis.  Multiple biopsies taken.  Biopsy showed chronic inflammation of the colon consistent with quiescent ulcerative colitis. ?  ?Colonoscopy from February 2002: Indication, rectal bleeding and mucus.  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 ?  ?Colonoscopy June 2013: Received pathology from June 2013 colonoscopy.  Unfortunately I did not get a copy of the colonoscopy report.  On random colon biopsies she had mild focal active colitis, inflammation nonspecific.  Compatible with quiescent ulcerative colitis.  Dysplastic changes not seen. ? ?Current Outpatient Medications  ?Medication Sig Dispense Refill  ? Ascorbic Acid (VITAMIN C) 1000 MG tablet Take 1,000 mg by mouth daily.    ? betamethasone valerate (VALISONE) 0.1 % cream Apply 1 application topically daily as needed (dryness).    ? Calcium Carb-Cholecalciferol (CALCIUM-VITAMIN D3) 600-200 MG-UNIT TABS Take 1 tablet by mouth daily.    ? cetirizine (ZYRTEC) 10 MG tablet Take 10 mg by mouth daily as needed for allergies.    ? Cholecalciferol (VITAMIN D) 50 MCG (2000 UT) tablet Take 2,000 Units by mouth daily.    ? meloxicam (MOBIC) 7.5 MG tablet Take 7.5 mg by mouth daily as needed.    ? montelukast (SINGULAIR) 10 MG tablet Take 10 mg by mouth daily.    ? Multiple Vitamins-Minerals (CENTRUM SILVER ADULT 50+ PO) Take 1 tablet by mouth daily.    ? rosuvastatin (CRESTOR) 5 MG tablet  Take 5 mg by mouth daily.    ? vitamin B-12 (CYANOCOBALAMIN) 500 MCG tablet Take 1,000 mcg by mouth 3 (three) times a week.    ? ?No current facility-administered medications for this visit.  ? ? ?Allergies as of 10/03/2021 - Review Complete 10/03/2021  ?Allergen Reaction Noted  ? Codeine  10/14/2018  ? Latex  10/14/2018  ? ?Past Medical History:  ?Diagnosis Date  ? Anemia   ? "long time ago"  ? Anxiety   ? Arthritis   ? Asthma   ? Atypical nevus  02/12/2011  ? Left Shoulder - Moderate and Right Buttock - Mild  ? Atypical nevus 10/01/2012  ? Left Lower Back - Severe   ? BCC (basal cell carcinoma of skin) 07/01/2007  ? Right Lower Med Eyelid  ? Colitis   ? Years ago  ? Heart murmur   ? History of bronchoscopy   ? History of claustrophobia   ? Hyperlipidemia   ? IBS (irritable bowel syndrome)   ? PONV (postoperative nausea and vomiting)   ? with tubal ligation  ? 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 Center Nose  ? Ulcerative colitis ([place])   ?    ? ?Past Surgical History:  ?Procedure Laterality Date  ? BRONCHOSCOPY  1995  ? COLONOSCOPY  2009  ? Dr. West Carbo: Procedure being done for surveillance for chronic ulcerative colitis.  No endoscopic appearance of active colitis.  Random colon biopsies taken to look for dysplasia.  Biopsy showed nonspecific mild chronic inflammation of the colon  ? TONSILLECTOMY    ? TOTAL KNEE ARTHROPLASTY Left 09/07/2020  ? Procedure: LEFT TOTAL KNEE ARTHROPLASTY;  Surgeon: [name], MD;  Location: [facility];  Service: Orthopedics;  Laterality: Left;  ? TUBAL LIGATION    ? ?Family History  ?Problem Relation Age of Onset  ? Colitis Sister   ?     require total colectomy. UC  ? Lung cancer Sister   ? Colon cancer Neg Hx   ? ?Social History  ? ?Tobacco Use  ? Smoking status: Never  ? Smokeless tobacco: Never  ?Vaping Use  ? Vaping Use: Former  ?Substance Use Topics  ? Alcohol use: Not Currently  ? Drug use: Never  ? ? ?ROS: ? ?General: Negative for anorexia, weight loss, fever, chills, fatigue, weakness. ?ENT: Negative for hoarseness, difficulty swallowing , nasal congestion. ?CV: Negative for chest pain, angina, palpitations, dyspnea on exertion, peripheral edema.  ?Respiratory: Negative for dyspnea at rest, dyspnea on exertion, cough, sputum, wheezing.  ?GI: See history of present illness. ?GU:  Negative for dysuria, hematuria, urinary incontinence, urinary frequency, nocturnal urination.  ?Endo: Negative for unusual  weight change.  ?  ?Physical Examination: ? ? BP 118/70 (BP Location: Right Arm, Patient Position: Sitting, Cuff Size: Normal)   Pulse 87   Temp (!) 96.8 ?F (36 ?C) (Temporal)   Ht 5' 4"  (1.626 m)   Wt 144 lb 3.2 oz (65.4 kg)   SpO2 98%   BMI 24.75 kg/m?  ? ?General: Well-nourished, well-developed in no acute distress.  ?Eyes: No icterus. ?Mouth: masked ?Lungs: Clear to auscultation bilaterally.  ?Heart: Regular rate and rhythm, no murmurs rubs or gallops.  ?Abdomen: Bowel sounds are normal, nontender, nondistended, no hepatosplenomegaly or masses, no abdominal bruits or hernia , no rebound or guarding.   ?Extremities: No lower extremity edema. No clubbing or deformities. ?Neuro: Alert and oriented x 4   ?Skin: Warm and dry, no jaundice.   ?Psych:  Alert and cooperative, normal mood and affect. ? ? ?Assessment: ? 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  Last colonoscopy in 2013, quiescent ulcerative colitis.  Dysplastic changes not seen.Marland Kitchen  No prior history of colon polyps.  Historically has done fairly well from a UC standpoint.  For the past 2 to 3 years she has had 2-3 stools every morning, feels like she does not complete her stool the first time.  This has been stable now for a couple of years. Denies any blood in the stool.  Her former PCP continues to encourage her to have 1 last high risk screening colonoscopy.  She presents today to get that scheduled.  She has issues with drinking large volume preps, previously had some vomiting.  Does not do well with liquids in general.  We will try to get a short volume prep, if not available then she will have split dose prepping all the day before and has been instructed on how to flavor each individual glass with water flavoring drops (not red or purple). ? ? ? ?Plan: ?High risk screening colonoscopy with Dr. Gala Romney.  ASA 2.  I have discussed the risks, alternatives, benefits with regards to  but not limited to the risk of reaction to medication, bleeding, infection, perforation and the patient is agreeable to proceed. Written consent to be obtained. ? ?

## 2021-10-03 NOTE — Telephone Encounter (Signed)
She wanted to know if she needed to have polish removed, advised yes correct ?

## 2021-10-05 ENCOUNTER — Telehealth: Payer: Self-pay

## 2021-10-05 NOTE — Telephone Encounter (Signed)
Patient called stated she had surgery last year and wanted to know if she needed pre med for her dental appointment. Please advise [phone] ?

## 2021-10-06 NOTE — Telephone Encounter (Signed)
Emailed

## 2021-10-12 ENCOUNTER — Telehealth: Payer: Self-pay | Admitting: Internal Medicine

## 2021-10-12 NOTE — Telephone Encounter (Signed)
Noted  

## 2021-10-12 NOTE — Telephone Encounter (Signed)
Called pt, she wants to cancel TCS d/t she is afraid she will have large bill and unable to do at this time. Advised her she can make payments. Advised her to let office know if she decides to proceed with procedure. Endo scheduler informed to cancel TCS. ?

## 2021-10-12 NOTE — Telephone Encounter (Signed)
PATIENT CALLED AND WANTS TO CANCEL HER COLONOSCOPY ?

## 2021-10-30 ENCOUNTER — Ambulatory Visit (HOSPITAL_COMMUNITY): Admit: 2021-10-30 | Payer: Medicare Other | Admitting: Internal Medicine

## 2021-10-30 ENCOUNTER — Encounter (HOSPITAL_COMMUNITY): Payer: Self-pay

## 2021-10-30 SURGERY — COLONOSCOPY WITH PROPOFOL
Anesthesia: Monitor Anesthesia Care

## 2021-11-19 IMAGING — DX DG KNEE 1-2V PORT*L*
2 series · 2 of 2 positions shown · non-contrast
Comparison: None.

CLINICAL DATA: Status post total knee replaced

EXAM:
PORTABLE LEFT KNEE - 1-2 VIEW

[knee ap]
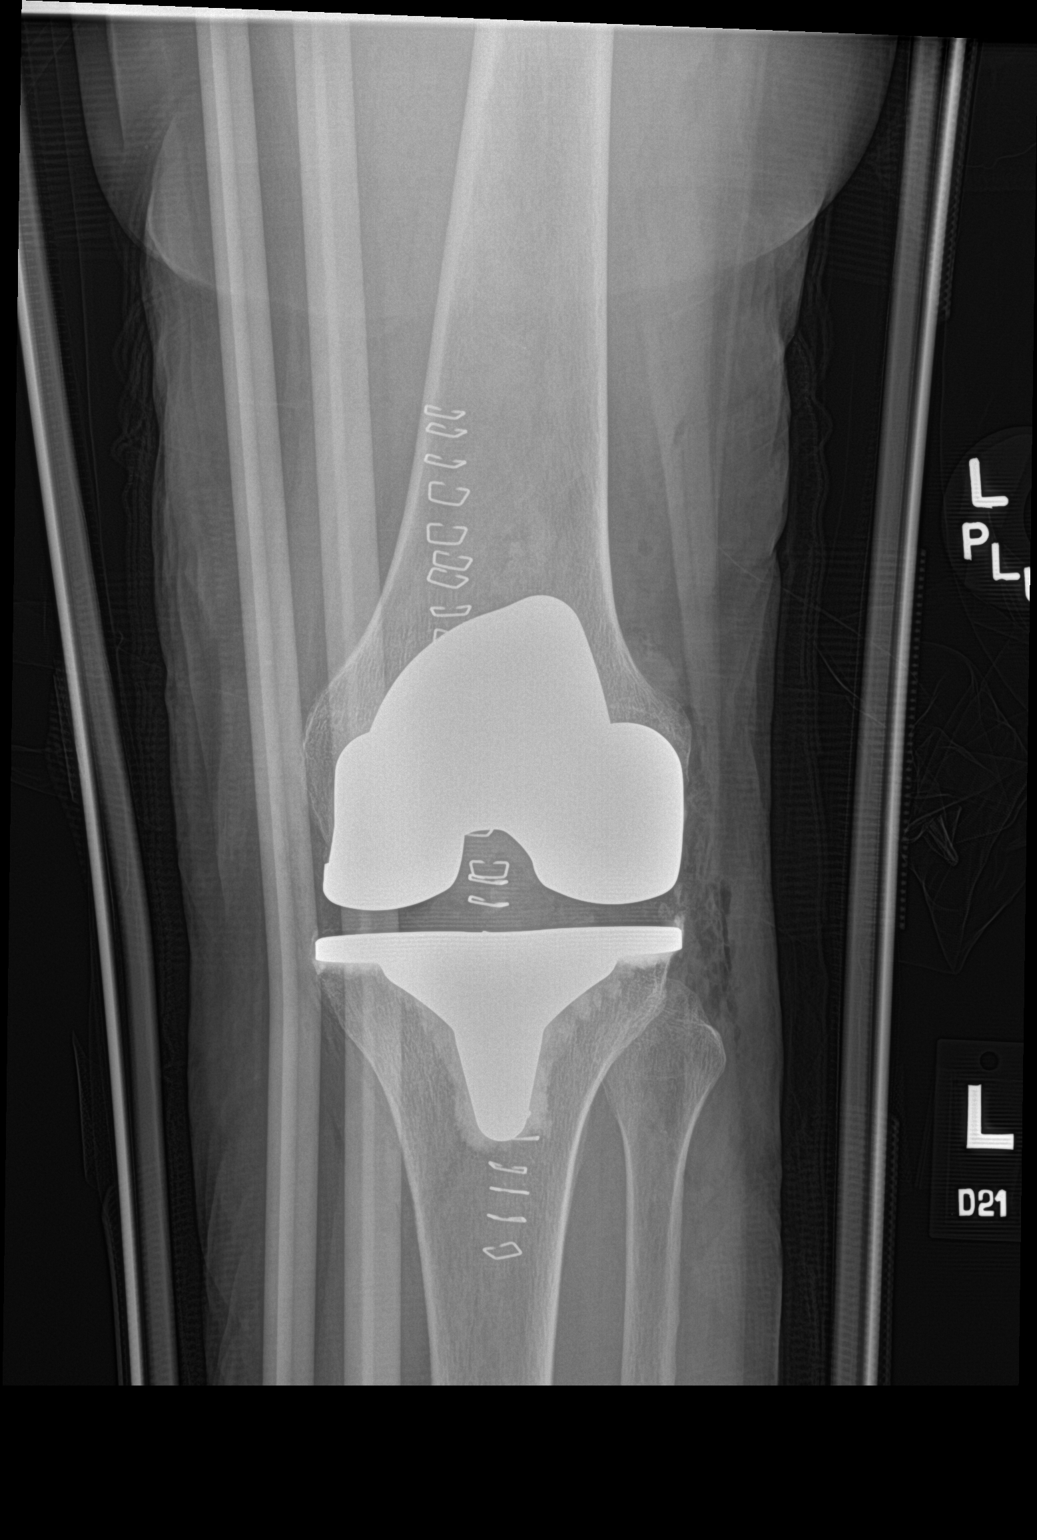

[knee lat]
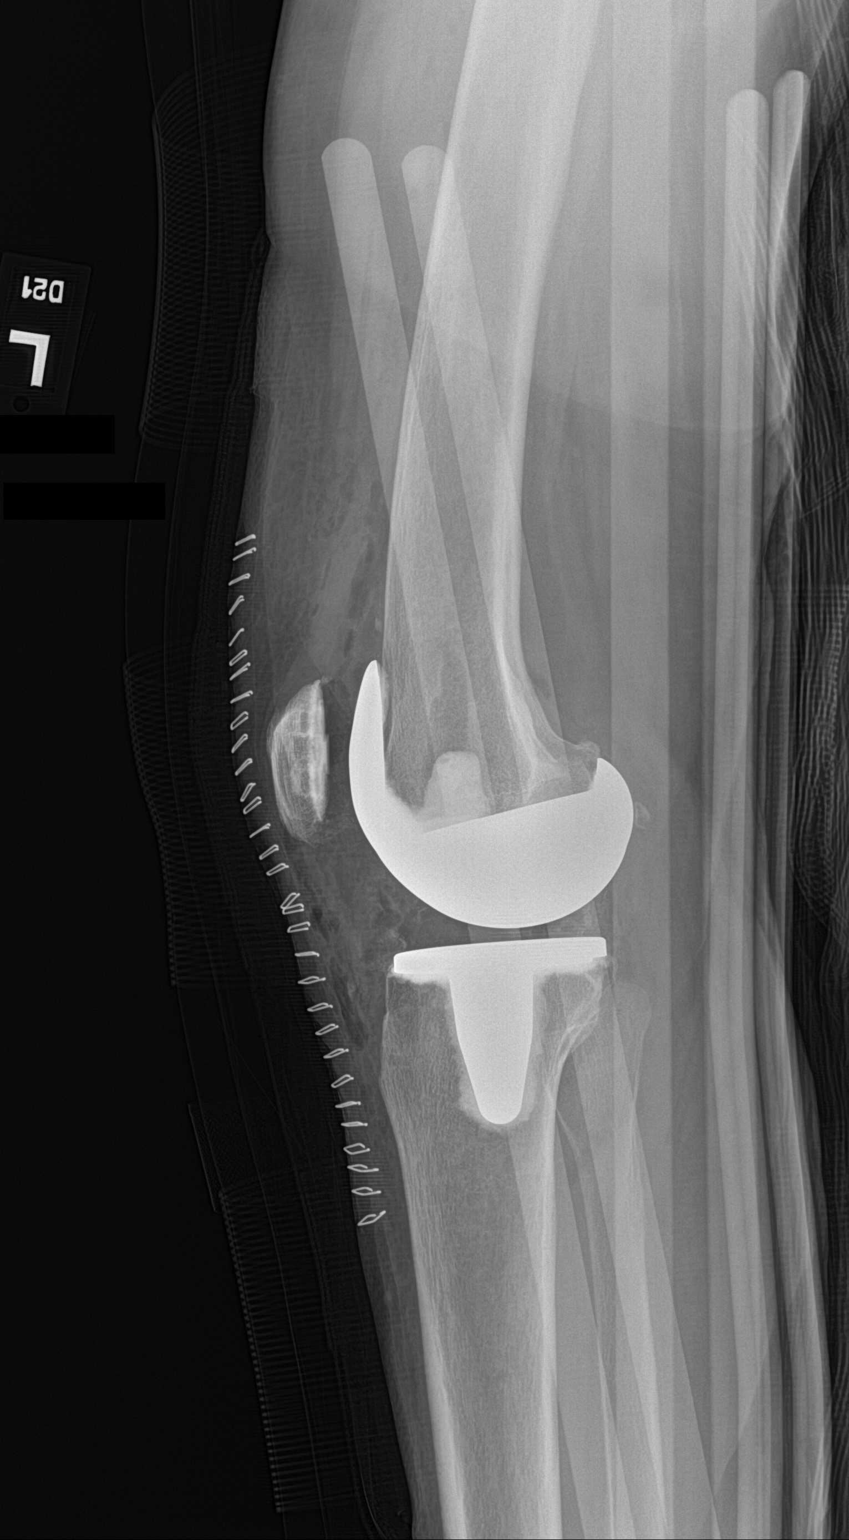

[2 of 2 positions shown; findings below may reference images not displayed]

FINDINGS: Frontal and lateral views were obtained. Patient is status post
total knee replacement with femoral and tibial prosthetic components
well-seated. No fracture or dislocation. No erosive change. There is
soft tissue air within the joint, an expected postoperative finding.
IMPRESSION: Status post total knee replacement with prosthetic components
well-seated. No fracture or dislocation. Acute postoperative changes
noted.

## 2022-06-07 ENCOUNTER — Encounter: Payer: Self-pay | Admitting: Orthopaedic Surgery

## 2022-06-07 ENCOUNTER — Ambulatory Visit (INDEPENDENT_AMBULATORY_CARE_PROVIDER_SITE_OTHER): Payer: Medicare Other | Admitting: Orthopaedic Surgery

## 2022-06-07 DIAGNOSIS — M51369 Other intervertebral disc degeneration, lumbar region without mention of lumbar back pain or lower extremity pain: Secondary | ICD-10-CM

## 2022-06-07 DIAGNOSIS — M5136 Other intervertebral disc degeneration, lumbar region: Secondary | ICD-10-CM | POA: Diagnosis not present

## 2022-06-07 MED ORDER — MELOXICAM 7.5 MG PO TABS: 7.5000 mg | ORAL_TABLET | Freq: Every day | ORAL | 3 refills | Status: DC | PRN

## 2022-06-08 DIAGNOSIS — M5136 Other intervertebral disc degeneration, lumbar region: Secondary | ICD-10-CM | POA: Insufficient documentation

## 2022-06-08 DIAGNOSIS — M51369 Other intervertebral disc degeneration, lumbar region without mention of lumbar back pain or lower extremity pain: Secondary | ICD-10-CM | POA: Insufficient documentation

## 2022-06-08 NOTE — Progress Notes (Signed)
Office Visit Note   Patient: Carol Roach           Date of Birth: [birth year]           MRN: [id number] Visit Date: 06/07/2022              Requested by: Donalynn Furlong, [place] [address] PCP: Donalynn Furlong, MD   Assessment & Plan: Visit Diagnoses:  1. Other intervertebral disc degeneration, lumbar region     Plan: Previous lumbar MRI done at [facility] showed disc degeneration L2-S1 with facet arthropathy.  No severe areas of stenosis.  Refill meloxicam x3.  She will let us know if she has ongoing problems.  Follow-Up Instructions: No follow-ups on file.   Orders:  No orders of the defined types were placed in this encounter.  Meds ordered this encounter  Medications   meloxicam (MOBIC) 7.5 MG tablet    Sig: Take 1 tablet (7.5 mg total) by mouth daily as needed.    Dispense:  30 tablet    Refill:  3      Procedures: No procedures performed   Clinical Data: No additional findings.   Subjective: Chief Complaint  Patient presents with   Lower Back - Pain    HPI 76 year old female avid golfer placed at [facility] golf course returns is having increased problems with her back.  She states when she takes meloxicam she is got good relief but only had 1 pill left.  Previous left total knee arthroplasty from February last year 2022 continues to do well.  She is back playing golf.  She continues to have back pain radiates into her buttocks but is significantly improved when she takes meloxicam.  Review of Systems all the systems noncontributory to HPI.   Objective: Vital Signs: There were no vitals taken for this visit.  Physical Exam Constitutional:      Appearance: She is well-developed.  HENT:     Head: Normocephalic.     Right Ear: External ear normal.     Left Ear: External ear normal. There is no impacted cerumen.  Eyes:     Pupils: Pupils are equal, round, and reactive to light.   Neck:     Thyroid: No thyromegaly.     Trachea: No tracheal deviation.  Cardiovascular:     Rate and Rhythm: Normal rate.  Pulmonary:     Effort: Pulmonary effort is normal.  Abdominal:     Palpations: Abdomen is soft.  Musculoskeletal:     Cervical back: No rigidity.  Skin:    General: Skin is warm and dry.  Neurological:     Mental Status: She is alert and oriented to person, place, and time.  Psychiatric:        Behavior: Behavior normal.     Ortho Exam some sciatic notch tenderness.  Increased pain with lumbar tilting and rotation forward flexion and extension reflexes are intact she is able to heel and toe walk.  Specialty Comments:  No specialty comments available.  Imaging: No results found.   PMFS History: Patient Active Problem List   Diagnosis Date Noted   Other intervertebral disc degeneration, lumbar region 06/08/2022   Ulcerative colitis ([place])    Encounter for screening for colorectal malignant neoplasm in high risk patient    S/P total knee arthroplasty 09/08/2020   Arthritis of left knee 09/07/2020   Colitis    Constipation    Unilateral primary  osteoarthritis, left knee 06/04/2019   Past Medical History:  Diagnosis Date   Anemia    "long time ago"   Anxiety    Arthritis    Asthma    Atypical nevus 02/12/2011   Left Shoulder - Moderate and Right Buttock - Mild   Atypical nevus 10/01/2012   Left Lower Back - Severe    BCC (basal cell carcinoma of skin) 07/01/2007   Right Lower Med Eyelid   Colitis    Years ago   Heart murmur    History of bronchoscopy    History of claustrophobia    Hyperlipidemia    IBS (irritable bowel syndrome)    PONV (postoperative nausea and vomiting)    with tubal ligation   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 UC   Lung cancer Sister    Colon cancer  Neg Hx     Past Surgical History:  Procedure Laterality Date   BRONCHOSCOPY  1995   COLONOSCOPY  2009   Dr. West Carbo: Procedure being done for surveillance for chronic ulcerative colitis.  No endoscopic appearance of active colitis.  Random colon biopsies taken to look for dysplasia.  Biopsy showed nonspecific mild chronic inflammation of the colon   TONSILLECTOMY     TOTAL KNEE ARTHROPLASTY Left 09/07/2020   Procedure: LEFT TOTAL KNEE ARTHROPLASTY;  Surgeon: [name], MD;  Location: [facility];  Service: Orthopedics;  Laterality: Left;   TUBAL LIGATION     Social History   Occupational History   Not on file  Tobacco Use   Smoking status: Never   Smokeless tobacco: Never  Vaping Use   Vaping Use: Former  Substance and Sexual Activity   Alcohol use: Not Currently   Drug use: Never   Sexual activity: Not Currently    Partners: Male    Comment: spouse

## 2023-10-03 ENCOUNTER — Other Ambulatory Visit: Payer: Self-pay | Admitting: Orthopaedic Surgery

## 2023-10-03 MED ORDER — MELOXICAM 7.5 MG PO TABS: 7.5000 mg | ORAL_TABLET | Freq: Every day | ORAL | 3 refills | Status: AC | PRN
